# Patient Record
Sex: Female | Born: 1985 | Race: Black or African American | Hispanic: No | Marital: Single | Smoking: Current every day smoker
Health system: Southern US, Community
[De-identification: ages and names within clinical notes are randomized; demographics above are authoritative.]

## PROBLEM LIST (undated history)

## (undated) DIAGNOSIS — O009 Unspecified ectopic pregnancy without intrauterine pregnancy: Secondary | ICD-10-CM

---

## 2004-07-15 ENCOUNTER — Emergency Department (HOSPITAL_COMMUNITY): Admission: EM | Admit: 2004-07-15 | Discharge: 2004-07-15 | Payer: Self-pay | Admitting: Emergency Medicine

## 2011-09-24 ENCOUNTER — Emergency Department (HOSPITAL_BASED_OUTPATIENT_CLINIC_OR_DEPARTMENT_OTHER)
Admission: EM | Admit: 2011-09-24 | Discharge: 2011-09-24 | Disposition: A | Discharge status: Other Acute Inpatient Hospital | Payer: Medicaid Other | Source: Home / Self Care | Attending: Emergency Medicine | Admitting: Emergency Medicine

## 2011-09-24 ENCOUNTER — Encounter: Payer: Self-pay | Admitting: *Deleted

## 2011-09-24 ENCOUNTER — Inpatient Hospital Stay (HOSPITAL_COMMUNITY)
Admission: AD | Admit: 2011-09-24 | Discharge: 2011-09-24 | Discharge status: Against Medical Advice | Payer: Medicaid Other | Source: Ambulatory Visit | Attending: Obstetrics and Gynecology | Admitting: Obstetrics and Gynecology

## 2011-09-24 ENCOUNTER — Encounter (HOSPITAL_COMMUNITY): Payer: Self-pay | Admitting: *Deleted

## 2011-09-24 DIAGNOSIS — O479 False labor, unspecified: Secondary | ICD-10-CM | POA: Insufficient documentation

## 2011-09-24 DIAGNOSIS — O0930 Supervision of pregnancy with insufficient antenatal care, unspecified trimester: Secondary | ICD-10-CM | POA: Insufficient documentation

## 2011-09-24 DIAGNOSIS — O99891 Other specified diseases and conditions complicating pregnancy: Secondary | ICD-10-CM | POA: Insufficient documentation

## 2011-09-24 DIAGNOSIS — IMO0001 Reserved for inherently not codable concepts without codable children: Secondary | ICD-10-CM

## 2011-09-24 LAB — DIFFERENTIAL
Basophils Absolute: 0 10*3/uL (ref 0.0–0.1)
Basophils Relative: 0 % (ref 0–1)
Eosinophils Relative: 1 % (ref 0–5)
Monocytes Absolute: 0.7 10*3/uL (ref 0.1–1.0)
Neutro Abs: 6.2 10*3/uL (ref 1.7–7.7)

## 2011-09-24 LAB — CBC
HCT: 28.8 % — ABNORMAL LOW (ref 36.0–46.0)
MCHC: 33 g/dL (ref 30.0–36.0)
MCV: 73.9 fL — ABNORMAL LOW (ref 78.0–100.0)
Platelets: 325 10*3/uL (ref 150–400)
RDW: 15.3 % (ref 11.5–15.5)
RDW: 15.8 % — ABNORMAL HIGH (ref 11.5–15.5)
WBC: 12 10*3/uL — ABNORMAL HIGH (ref 4.0–10.5)

## 2011-09-24 MED ORDER — SODIUM CHLORIDE 0.9 % IV BOLUS (SEPSIS): 1000.0000 mL | Freq: Once | INTRAVENOUS | Status: DC

## 2011-09-24 MED ORDER — SODIUM CHLORIDE 0.9 % IV BOLUS (SEPSIS)
1000.0000 mL | Freq: Once | INTRAVENOUS | Status: AC
  Administered 2011-09-24: 1000 mL via INTRAVENOUS

## 2011-09-24 NOTE — ED Notes (Signed)
[facility] en[facility] to transport to [facility]

## 2011-09-24 NOTE — ED Notes (Signed)
Called Dr Ralene Muskrat with [facility] is paging Dr. Richardson Dopp- is on call.

## 2011-09-24 NOTE — Progress Notes (Signed)
Pt brought in by EMS c/o rectal pain and pressure; ? Labor; requesting to have a repeat c-section

## 2011-09-24 NOTE — ED Notes (Signed)
Called [facility] to transfer to the [facility]

## 2011-09-24 NOTE — ED Notes (Signed)
JUst released from prison yesterday. Last OBGYN  Visit june

## 2011-09-24 NOTE — ED Notes (Signed)
Pt states her "butt is hurting like unbearable pressure" PA Langston Masker at the bedside to evaluate pt. Fetal heart rate 137. Cervical check performed by PA Sofia states no change in dilation but babys head is very low. Pt on left side states pressure in rectal area is constant no fluid escape from vagina remains on monitor

## 2011-09-24 NOTE — ED Notes (Signed)
Pt c/o labor pain x 45 min-denies any fluid leakage-G5 P4-first delivery vaginal-other 4 csection-pt placed on monitor upon arrival-[facility]-EDP [place] at [facility] upon arrival-bimanual reports 3cm-pt currently on left side-O2 2 LNC started-monitor reads [id number]

## 2011-09-24 NOTE — ED Notes (Signed)
Pt states labor, sent here by  OBGYN  For eval.  Cramping .  Pt states 40 weeks

## 2011-09-24 NOTE — ED Notes (Signed)
Pt has very limited prenatal care; she has been incarcerated and just got out of prison yesterday; Dr Chilton Si has agreed to be her provider for emergency care up until 10/06/11

## 2011-09-24 NOTE — Progress Notes (Signed)
Clydie Braun RN from [facility] called for a pt with c/o Labor. EDC 09/21/2011. Pt states she is seen by Dr. Neva Seat and is a G5P4. EFM being applied by [facility] ED staff.

## 2011-09-24 NOTE — ED Provider Notes (Signed)
History      CSN: [id number] Arrival date & time: 09/24/2011  5:35 PM  Chief Complaint  Patient presents with  . Labor Eval     HPI Donna Zamora is a 25 y.o. female who presents to the Emergency Department complaining of labor pains that come and go. Patient last felt the baby move this AM.  Patient was due 09/21/11.  No loss of fluid.  Denies nausea and vomiting.  No vaginal bleeding. Reports her symptoms feel like contractions. First delivery was vagina, c sections since then.  OB GYN:  [facility].     PAST MEDICAL HISTORY:  History reviewed. No pertinent past medical history.  PAST SURGICAL HISTORY:  Past Surgical History  Procedure Date  . Cesarean section     FAMILY HISTORY:  History reviewed. No pertinent family history.   SOCIAL HISTORY: History   Social History  . Marital Status: Single    Spouse Name: N/A    Number of Children: N/A  . Years of Education: N/A   Social History Main Topics  . Smoking status: Current Everyday Smoker -- 0.5 packs/day  . Smokeless tobacco: None  . Alcohol Use: No  . Drug Use:   . Sexually Active:    Other Topics Concern  . None   Social History Narrative  . None     OB History    Grav Para Term Preterm Abortions TAB SAB Ect Mult Living   5 4              Review of Systems  Allergies  Review of patient's allergies indicates no known allergies.  Home Medications   Current Outpatient Rx  Name Route Sig Dispense Refill  . PRENATAL 27-0.8 MG PO TABS Oral Take 1 tablet by mouth daily.        BP 110/70  Pulse 74  Temp(Src) 98.9 F (37.2 C) (Oral)  Resp 16  SpO2 100%  LMP 12/25/2010  Physical Exam  Constitutional: She is oriented to person, place, and time. She appears well-developed and well-nourished.  HENT:  Head: Normocephalic.  Eyes: EOM are normal.  Neck: Normal range of motion.  Pulmonary/Chest: Effort normal.  Genitourinary:       Fast ultrasound: Fetal heart tones 135, no  bloody show, cervix 3 cm, 50% effaced   Musculoskeletal: Normal range of motion.  Neurological: She is alert and oriented to person, place, and time.  Psychiatric: She has a normal mood and affect.    ED Course  CRITICAL CARE Performed by: Lyanne Co Authorized by: Lyanne Co Total critical care time: 35 minutes Critical care time was exclusive of separately billable procedures and treating other patients. Critical care was necessary to treat or prevent imminent or life-threatening deterioration of the following conditions: labor and emminent delivery. Critical care was time spent personally by me on the following activities: discussions with consultants, development of treatment plan with patient or surrogate, evaluation of patient's response to treatment, examination of patient, obtaining history from patient or surrogate, ordering and review of radiographic studies and re-evaluation of patient's condition.   (including critical care time)  Labs Reviewed - No data to display No results found.   1. Active labor       MDM  Patient presenting past 2 days with what appears to be labor without loss of fluid.  I spoke with Dr. Debroah Loop who is on call for unassigned teaching service resuscitation to the [facility].  Patient is awaiting transfer  by ambulance this time.  Fetal heart rate appears to be around 130s the patient does appear to have some intermittent not necessarily foreign contractions on tocometry.  quick bedside ultrasound does is demonstrate baby in the head down position.  The patient will be n.p.o. at this time for suspected C-section later this evening.  Initial initial conversation was with Dr. Richardson Dopp who is covering for [facility] however it appears the patient has been discharged from [facility] Haskell service and therefore she will be cared for by the [facility] team.     I personally performed the services described in this documentation, which was scribed in my  presence. The recorded information has been reviewed and considered.    Lyanne Co, MD 09/24/11 [phone]

## 2011-09-25 LAB — RPR: RPR Ser Ql: NONREACTIVE

## 2011-09-25 LAB — HEPATITIS B SURFACE ANTIGEN: Hepatitis B Surface Ag: NEGATIVE

## 2011-10-29 ENCOUNTER — Encounter (HOSPITAL_COMMUNITY): Payer: Self-pay | Admitting: *Deleted

## 2012-05-25 ENCOUNTER — Encounter (HOSPITAL_BASED_OUTPATIENT_CLINIC_OR_DEPARTMENT_OTHER): Payer: Self-pay | Admitting: *Deleted

## 2012-05-25 ENCOUNTER — Emergency Department (HOSPITAL_BASED_OUTPATIENT_CLINIC_OR_DEPARTMENT_OTHER)
Admission: EM | Admit: 2012-05-25 | Discharge: 2012-05-25 | Disposition: A | Discharge status: Home / Self Care | Payer: Self-pay | Attending: Emergency Medicine | Admitting: Emergency Medicine

## 2012-05-25 DIAGNOSIS — R109 Unspecified abdominal pain: Secondary | ICD-10-CM | POA: Insufficient documentation

## 2012-05-25 DIAGNOSIS — N760 Acute vaginitis: Secondary | ICD-10-CM

## 2012-05-25 DIAGNOSIS — F17200 Nicotine dependence, unspecified, uncomplicated: Secondary | ICD-10-CM | POA: Insufficient documentation

## 2012-05-25 DIAGNOSIS — N898 Other specified noninflammatory disorders of vagina: Secondary | ICD-10-CM | POA: Insufficient documentation

## 2012-05-25 HISTORY — DX: Unspecified ectopic pregnancy without intrauterine pregnancy: O00.90

## 2012-05-25 LAB — URINALYSIS, ROUTINE W REFLEX MICROSCOPIC
Glucose, UA: NEGATIVE mg/dL
Hgb urine dipstick: NEGATIVE
Ketones, ur: NEGATIVE mg/dL
Protein, ur: NEGATIVE mg/dL

## 2012-05-25 LAB — WET PREP, GENITAL

## 2012-05-25 MED ORDER — METRONIDAZOLE 500 MG PO TABS: 500.0000 mg | ORAL_TABLET | Freq: Two times a day (BID) | ORAL | Status: AC

## 2012-05-25 NOTE — ED Notes (Signed)
Pelvic pain x 1 day- vaginal d/c x 3 days

## 2012-05-25 NOTE — ED Provider Notes (Signed)
History   This chart was scribed for Geoffery Lyons, MD by Melba Coon. The patient was seen in room MH04/MH04 and the patient's care was started at 6:09PM.    CSN: [id number]  Arrival date & time 05/25/12  1716   First MD Initiated Contact with Patient 05/25/12 1810      Chief Complaint  Patient presents with  . Abdominal Pain  . Vaginal Discharge    (Consider location/radiation/quality/duration/timing/severity/associated sxs/prior treatment) HPI Donna Zamora is a 26 y.o. female who presents to the Emergency Department complaining of constant, moderate to severe central abdominal pain with an onset yesterday; pt also c/o vaginal d/c for 3 days. Pt woke up this morning with worsening pain. Pt does not think she is pregnant. Pt has had a tubal pregnancy and has had 4 C-sections. LNMP: last week. Nausea present. No HA, fever, neck pain, sore throat, rash, back pain, CP, SOB, vomit, diarrhea, dysuria, or extremity pain, edema, weakness, numbness, or tingling. No known allergies. No other pertinent medical symptoms.   Past Medical History  Diagnosis Date  . No pertinent past medical history   . Ectopic pregnancy     Past Surgical History  Procedure Date  . Cesarean section     No family history on file.  History  Substance Use Topics  . Smoking status: Current Everyday Smoker -- 0.5 packs/day  . Smokeless tobacco: Never Used  . Alcohol Use: No    OB History    Grav Para Term Preterm Abortions TAB SAB Ect Mult Living   5 4 4       4       Review of Systems 10 Systems reviewed and all are negative for acute change except as noted in the HPI.   Allergies  Review of patient's allergies indicates no known allergies.  Home Medications   Current Outpatient Rx  Name Route Sig Dispense Refill  . PRENATAL 27-0.8 MG PO TABS Oral Take 1 tablet by mouth daily.        BP 105/70  Pulse 93  Temp(Src) 98.2 F (36.8 C) (Oral)  Resp 18  Ht 5\' 6"  (1.676 m)  Wt 153 lb (69.4  kg)  BMI 24.69 kg/m2  SpO2 100%  LMP 05/11/2012  Breastfeeding? No  Physical Exam  Nursing note and vitals reviewed. Constitutional: She appears well-developed and well-nourished.       Awake, alert, nontoxic appearance.  HENT:  Head: Normocephalic and atraumatic.  Eyes: Right eye exhibits no discharge. Left eye exhibits no discharge.  Neck: Normal range of motion. Neck supple.  Cardiovascular: Normal rate, regular rhythm and normal heart sounds.   No murmur heard. Pulmonary/Chest: Effort normal. She exhibits no tenderness.  Abdominal: Soft. There is tenderness (suprapubic). There is no rebound.  Genitourinary: Vaginal discharge (scant) found.       No CMT or adnexal masses  Musculoskeletal: Normal range of motion. She exhibits no tenderness (No cva tenderness).       Baseline ROM, no obvious new focal weakness.  Neurological: She is alert.       Mental status and motor strength appears baseline for patient and situation.  Skin: Skin is warm. No rash noted.  Psychiatric: She has a normal mood and affect. Her behavior is normal.    ED Course  Procedures (including critical care time)  DIAGNOSTIC STUDIES: Oxygen Saturation is 100% on room air, normal by my interpretation.    COORDINATION OF CARE:   Labs Reviewed - No data to display No results  found.   No diagnosis found.    MDM  The wet prep shows many clue cells, this appears to be bv.  Will treat with flagyl, follow up prn if worsens.  GC/Chlamydia pending.  I personally performed the services described in this documentation, which was scribed in my presence. The recorded information has been reviewed and considered.        Geoffery Lyons, MD 05/25/12 [phone]

## 2012-05-25 NOTE — Discharge Instructions (Signed)
Bacterial Vaginosis Bacterial vaginosis (BV) is a vaginal infection where the normal balance of bacteria in the vagina is disrupted. The normal balance is then replaced by an overgrowth of certain bacteria. There are several different kinds of bacteria that can cause BV. BV is the most common vaginal infection in women of childbearing age. CAUSES   The cause of BV is not fully understood. BV develops when there is an increase or imbalance of harmful bacteria.   Some activities or behaviors can upset the normal balance of bacteria in the vagina and put women at increased risk including:   Having a new sex partner or multiple sex partners.   Douching.   Using an intrauterine device (IUD) for contraception.   It is not clear what role sexual activity plays in the development of BV. However, women that have never had sexual intercourse are rarely infected with BV.  Women do not get BV from toilet seats, bedding, swimming pools or from touching objects around them.  SYMPTOMS   Grey vaginal discharge.   A fish-like odor with discharge, especially after sexual intercourse.   Itching or burning of the vagina and vulva.   Burning or pain with urination.   Some women have no signs or symptoms at all.  DIAGNOSIS  Your caregiver must examine the vagina for signs of BV. Your caregiver will perform lab tests and look at the sample of vaginal fluid through a microscope. They will look for bacteria and abnormal cells (clue cells), a pH test higher than 4.5, and a positive amine test all associated with BV.  RISKS AND COMPLICATIONS   Pelvic inflammatory disease (PID).   Infections following gynecology surgery.   Developing HIV.   Developing herpes virus.  TREATMENT  Sometimes BV will clear up without treatment. However, all women with symptoms of BV should be treated to avoid complications, especially if gynecology surgery is planned. Female partners generally do not need to be treated. However,  BV may spread between female sex partners so treatment is helpful in preventing a recurrence of BV.   BV may be treated with antibiotics. The antibiotics come in either pill or vaginal cream forms. Either can be used with nonpregnant or pregnant women, but the recommended dosages differ. These antibiotics are not harmful to the baby.   BV can recur after treatment. If this happens, a second round of antibiotics will often be prescribed.   Treatment is important for pregnant women. If not treated, BV can cause a premature delivery, especially for a pregnant woman who had a premature birth in the past. All pregnant women who have symptoms of BV should be checked and treated.   For chronic reoccurrence of BV, treatment with a type of prescribed gel vaginally twice a week is helpful.  HOME CARE INSTRUCTIONS   Finish all medication as directed by your caregiver.   Do not have sex until treatment is completed.   Tell your sexual partner that you have a vaginal infection. They should see their caregiver and be treated if they have problems, such as a mild rash or itching.   Practice safe sex. Use condoms. Only have 1 sex partner.  PREVENTION  Basic prevention steps can help reduce the risk of upsetting the natural balance of bacteria in the vagina and developing BV:  Do not have sexual intercourse (be abstinent).   Do not douche.   Use all of the medicine prescribed for treatment of BV, even if the signs and symptoms go away.     Tell your sex partner if you have BV. That way, they can be treated, if needed, to prevent reoccurrence.  SEEK MEDICAL CARE IF:   Your symptoms are not improving after 3 days of treatment.   You have increased discharge, pain, or fever.  MAKE SURE YOU:   Understand these instructions.   Will watch your condition.   Will get help right away if you are not doing well or get worse.  FOR MORE INFORMATION  Division of STD Prevention (DSTDP), Centers for Disease  Control and Prevention: www.cdc.gov/std American Social Health Association (ASHA): www.ashastd.org  Document Released: 12/03/2005 Document Revised: 11/22/2011 Document Reviewed: 05/26/2009 ExitCare Patient Information 2012 [employer]. 

## 2012-05-26 LAB — GC/CHLAMYDIA PROBE AMP, GENITAL: Chlamydia, DNA Probe: NEGATIVE

## 2012-06-12 ENCOUNTER — Emergency Department (HOSPITAL_BASED_OUTPATIENT_CLINIC_OR_DEPARTMENT_OTHER)
Admission: EM | Admit: 2012-06-12 | Discharge: 2012-06-13 | Disposition: A | Discharge status: Home / Self Care | Payer: Self-pay | Attending: Emergency Medicine | Admitting: Emergency Medicine

## 2012-06-12 ENCOUNTER — Encounter (HOSPITAL_BASED_OUTPATIENT_CLINIC_OR_DEPARTMENT_OTHER): Payer: Self-pay

## 2012-06-12 DIAGNOSIS — Z880 Allergy status to penicillin: Secondary | ICD-10-CM | POA: Insufficient documentation

## 2012-06-12 DIAGNOSIS — O4690 Antepartum hemorrhage, unspecified, unspecified trimester: Secondary | ICD-10-CM

## 2012-06-12 DIAGNOSIS — F17200 Nicotine dependence, unspecified, uncomplicated: Secondary | ICD-10-CM | POA: Insufficient documentation

## 2012-06-12 DIAGNOSIS — N898 Other specified noninflammatory disorders of vagina: Secondary | ICD-10-CM | POA: Insufficient documentation

## 2012-06-12 DIAGNOSIS — Z888 Allergy status to other drugs, medicaments and biological substances status: Secondary | ICD-10-CM | POA: Insufficient documentation

## 2012-06-12 DIAGNOSIS — F1910 Other psychoactive substance abuse, uncomplicated: Secondary | ICD-10-CM

## 2012-06-12 LAB — URINALYSIS, ROUTINE W REFLEX MICROSCOPIC
Ketones, ur: 15 mg/dL — AB
Leukocytes, UA: NEGATIVE
Nitrite: NEGATIVE
Protein, ur: 100 mg/dL — AB

## 2012-06-12 LAB — URINE MICROSCOPIC-ADD ON

## 2012-06-12 NOTE — ED Notes (Signed)
C/o vaginal bleeding/spotting/cramping started today-Positive home pregnancy test June 10-reports pd x 2 in May-G7 P6

## 2012-06-13 ENCOUNTER — Other Ambulatory Visit (HOSPITAL_BASED_OUTPATIENT_CLINIC_OR_DEPARTMENT_OTHER): Payer: Self-pay | Admitting: Emergency Medicine

## 2012-06-13 ENCOUNTER — Encounter (HOSPITAL_BASED_OUTPATIENT_CLINIC_OR_DEPARTMENT_OTHER): Payer: Self-pay | Admitting: Emergency Medicine

## 2012-06-13 ENCOUNTER — Encounter (HOSPITAL_BASED_OUTPATIENT_CLINIC_OR_DEPARTMENT_OTHER): Payer: Self-pay

## 2012-06-13 ENCOUNTER — Ambulatory Visit (HOSPITAL_BASED_OUTPATIENT_CLINIC_OR_DEPARTMENT_OTHER)
Admit: 2012-06-13 | Discharge: 2012-06-13 | Disposition: A | Discharge status: Home / Self Care | Payer: Medicaid Other | Attending: Emergency Medicine | Admitting: Emergency Medicine

## 2012-06-13 DIAGNOSIS — R58 Hemorrhage, not elsewhere classified: Secondary | ICD-10-CM

## 2012-06-13 DIAGNOSIS — O209 Hemorrhage in early pregnancy, unspecified: Secondary | ICD-10-CM | POA: Insufficient documentation

## 2012-06-13 LAB — WET PREP, GENITAL: Yeast Wet Prep HPF POC: NONE SEEN

## 2012-06-13 LAB — GC/CHLAMYDIA PROBE AMP, GENITAL
Chlamydia, DNA Probe: NEGATIVE
GC Probe Amp, Genital: NEGATIVE

## 2012-06-13 LAB — RAPID URINE DRUG SCREEN, HOSP PERFORMED
Benzodiazepines: NOT DETECTED
Cocaine: POSITIVE — AB
Tetrahydrocannabinol: POSITIVE — AB

## 2012-06-13 NOTE — ED Provider Notes (Signed)
History     CSN: [id number]  Arrival date & time 06/12/12  2232   First MD Initiated Contact with Patient 06/13/12 0002      Chief Complaint  Patient presents with  . Vaginal Bleeding    (Consider location/radiation/quality/duration/timing/severity/associated sxs/prior treatment) HPI This is a 26 year old black female who was seen here on June 9 and diagnosed with bacterial vaginosis. Her pregnancy test was negative at that time. She has subsequently had a positive home pregnancy test. She is here because of vaginal spotting that began today. The bleeding has not been severe. She's also had some moderate pelvic cramping associated with the spotting. She denies abdominal tenderness. She is gravida 7 para 6. Her blood type is O+ per old records.  Past Medical History  Diagnosis Date  . Ectopic pregnancy     Past Surgical History  Procedure Date  . Cesarean section     No family history on file.  History  Substance Use Topics  . Smoking status: Current Everyday Smoker -- 0.5 packs/day  . Smokeless tobacco: Never Used  . Alcohol Use: No    OB History    Grav Para Term Preterm Abortions TAB SAB Ect Mult Living   5 4 4       4       Review of Systems  All other systems reviewed and are negative.    Allergies  Penicillins and Risperidone and related  Home Medications   Current Outpatient Rx  Name Route Sig Dispense Refill  . IBUPROFEN 800 MG PO TABS Oral Take 800 mg by mouth every 8 (eight) hours as needed. Patient used this medication for her abdominal pain.      BP 130/88  Pulse 103  Temp 98.7 F (37.1 C) (Oral)  Resp 16  Ht 5\' 6"  (1.676 m)  Wt 153 lb (69.4 kg)  BMI 24.69 kg/m2  SpO2 100%  LMP 05/11/2012  Physical Exam General: Well-developed, well-nourished female in no acute distress; appearance consistent with age of record HENT: normocephalic, atraumatic Eyes: pupils equal round and reactive to light; extraocular muscles intact Neck:  supple Heart: regular rate and rhythm Lungs: clear to auscultation bilaterally Abdomen: soft; nondistended; nontender; bowel sounds present GU: Normal external genitalia; vaginal bleeding; no vaginal discharge; abnormal vaginal odor; cervical motion tenderness; no adnexal tenderness Extremities: No deformity; full range of motion Neurologic: Awake, alert and oriented; motor function intact in all extremities and symmetric; no facial droop Skin: Warm and dry Psychiatric: Tearful     ED Course  Procedures (including critical care time)     MDM   Nursing notes and vitals signs, including pulse oximetry, reviewed.  Summary of this visit's results, reviewed by myself:  Labs:  Results for orders placed during the hospital encounter of 06/12/12  URINALYSIS, ROUTINE W REFLEX MICROSCOPIC      Component Value Range   Color, Urine AMBER (*) YELLOW   APPearance CLOUDY (*) CLEAR   Specific Gravity, Urine 1.039 (*) 1.005 - 1.030   pH 6.0  5.0 - 8.0   Glucose, UA NEGATIVE  NEGATIVE mg/dL   Hgb urine dipstick NEGATIVE  NEGATIVE   Bilirubin Urine SMALL (*) NEGATIVE   Ketones, ur 15 (*) NEGATIVE mg/dL   Protein, ur 578 (*) NEGATIVE mg/dL   Urobilinogen, UA 1.0  0.0 - 1.0 mg/dL   Nitrite NEGATIVE  NEGATIVE   Leukocytes, UA NEGATIVE  NEGATIVE  PREGNANCY, URINE      Component Value Range   Preg Test, Ur POSITIVE (*)  NEGATIVE  URINE MICROSCOPIC-ADD ON      Component Value Range   Squamous Epithelial / LPF MANY (*) RARE   WBC, UA 0-2  <3 WBC/hpf   RBC / HPF 0-2  <3 RBC/hpf   Bacteria, UA MANY (*) RARE  URINE RAPID DRUG SCREEN (HOSP PERFORMED)      Component Value Range   Opiates NONE DETECTED  NONE DETECTED   Cocaine POSITIVE (*) NONE DETECTED   Benzodiazepines NONE DETECTED  NONE DETECTED   Amphetamines NONE DETECTED  NONE DETECTED   Tetrahydrocannabinol POSITIVE (*) NONE DETECTED   Barbiturates NONE DETECTED  NONE DETECTED  WET PREP, GENITAL      Component Value Range   Yeast  Wet Prep HPF POC NONE SEEN  NONE SEEN   Trich, Wet Prep NONE SEEN  NONE SEEN   Clue Cells Wet Prep HPF POC FEW (*) NONE SEEN   WBC, Wet Prep HPF POC FEW (*) NONE SEEN  HCG, QUANTITATIVE, PREGNANCY      Component Value Range   hCG, Beta Chain, Quant, S 2884 (*) <5 mIU/mL            Hanley Seamen, MD 06/13/12 0124

## 2012-06-13 NOTE — ED Provider Notes (Signed)
PT was seen last night by Dr. Read Drivers, came back this am for u/s which shows empty uterus, no evidence of ectopic.  Pt with minimal pain now.  Quant 2884.  Contacted the ob on call for her office at [facility] who is Dr Ferdinand Cava who will see pt on Monday for f/u.  I went back to the lobby to let the pt know about this and she has left, even though I had asked her to wait while I arrange follow up.  I did advise her of my concern regarding ectopic.  I attempted to call pt, by the phone number given is invalid.  Rolan Bucco, MD 06/13/12 1043

## 2013-05-30 IMAGING — US US OB TRANSVAGINAL
1 series · 13 of 28 positions shown · non-contrast
Comparison: None.

CLINICAL DATA: Pregnancy with vaginal bleeding

OBSTETRIC <14 WK US AND TRANSVAGINAL OB US
TECHNIQUE: Both transabdominal and transvaginal ultrasound
examinations were performed for complete evaluation of the
gestation as well as the maternal uterus, adnexal regions, and
pelvic cul-de-sac.  Transvaginal technique was performed to assess
early pregnancy.

[Series 1: us ob transvaginal · 0.24mm/px · 13 of 50 slices shown]
[im 2/50]
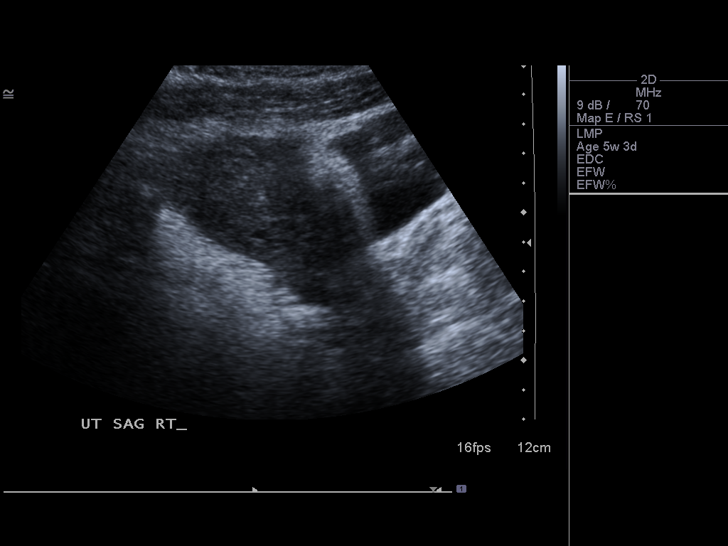
[im 6/50]
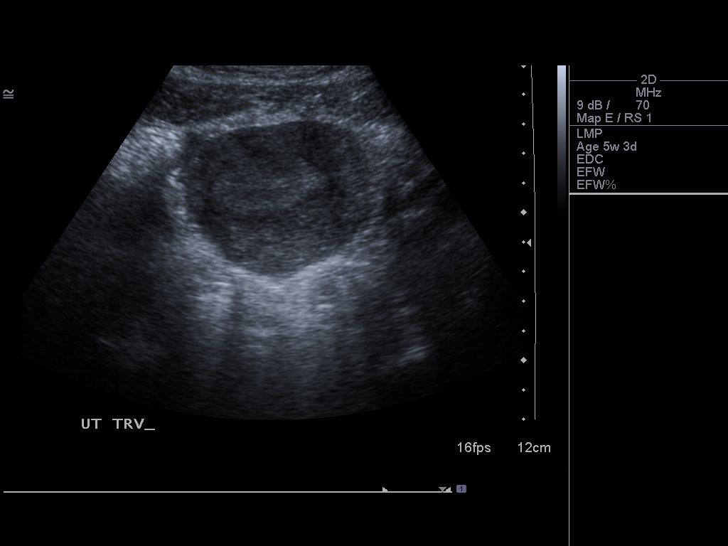
[im 10/50]
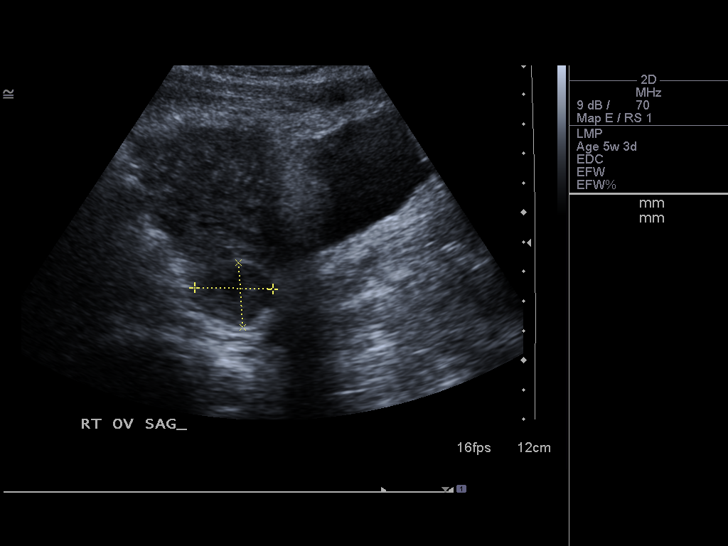
[im 13/50]
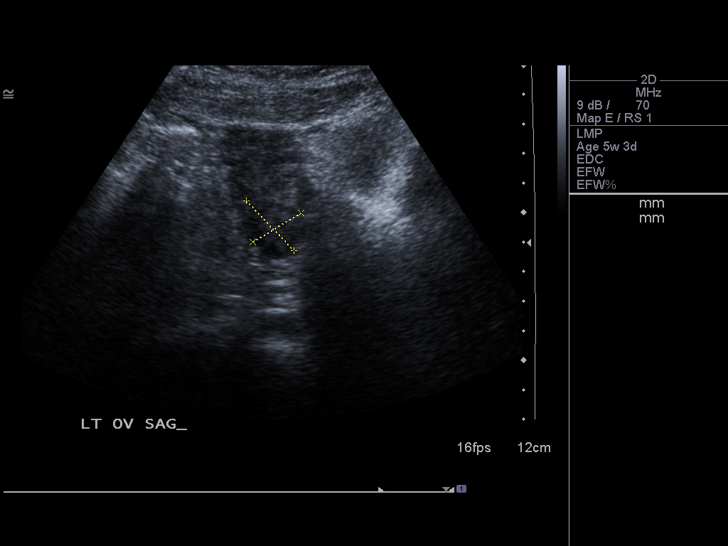
[im 17/50]
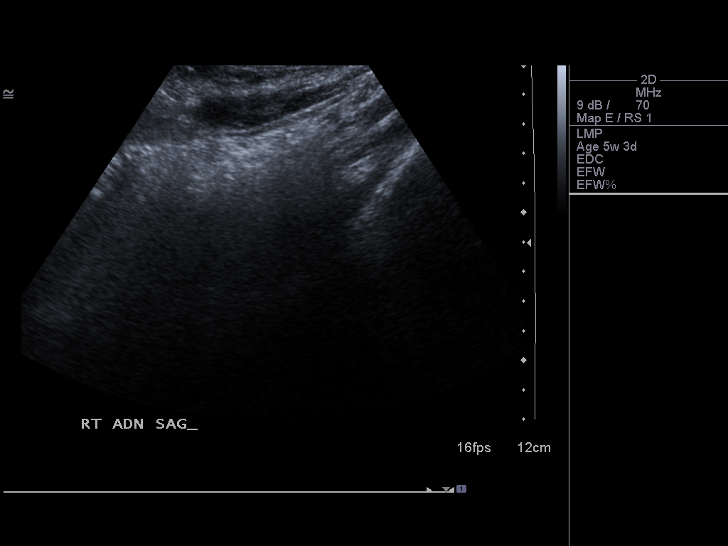
[im 20/50]
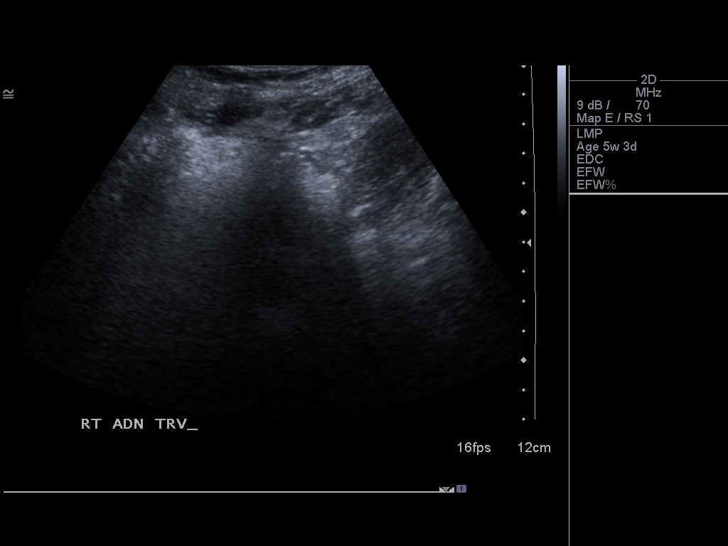
[im 26/50]
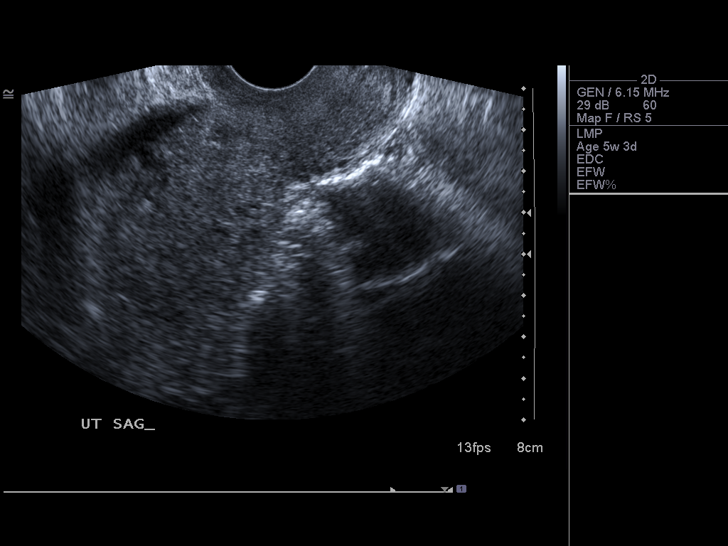
[im 30/50]
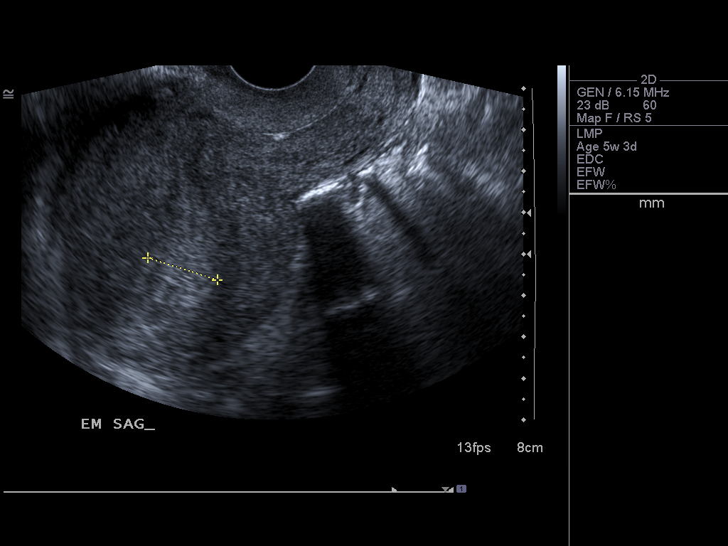
[im 33/50]
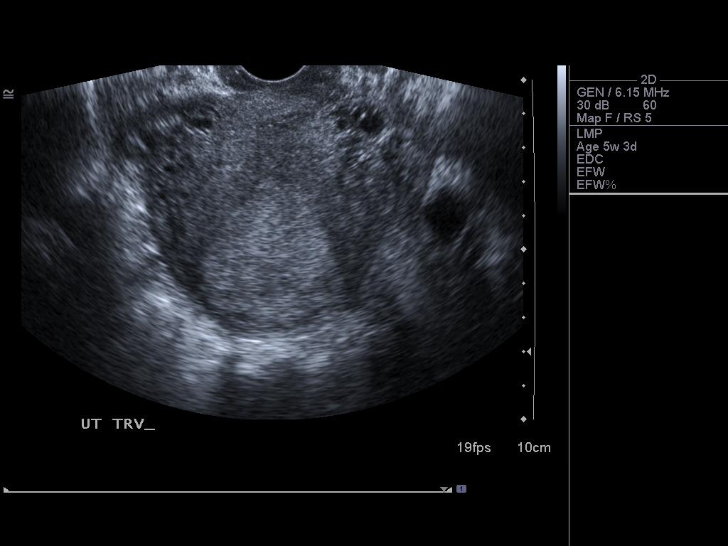
[im 37/50]
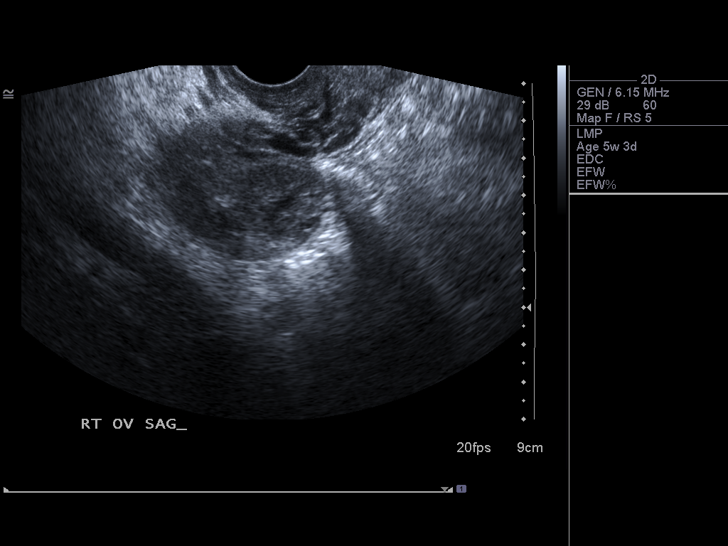
[im 40/50]
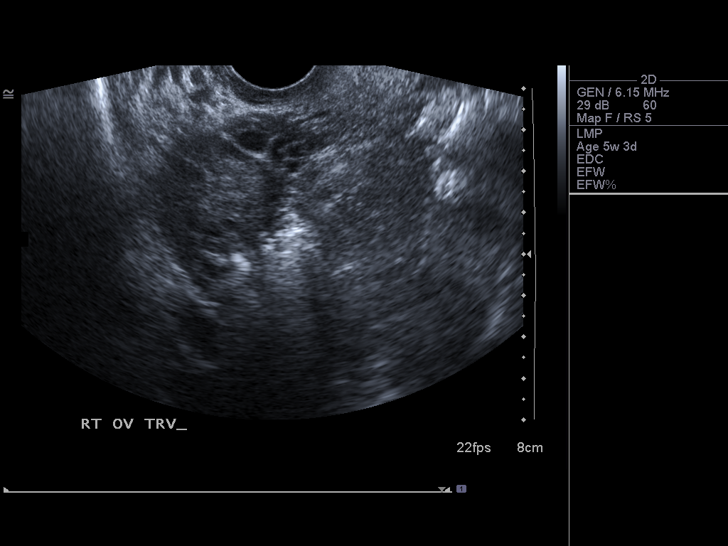
[im 44/50]
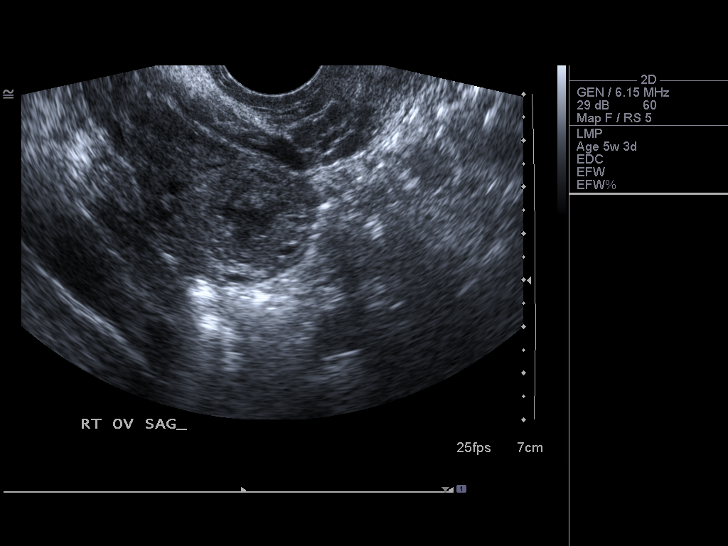
[im 48/50]
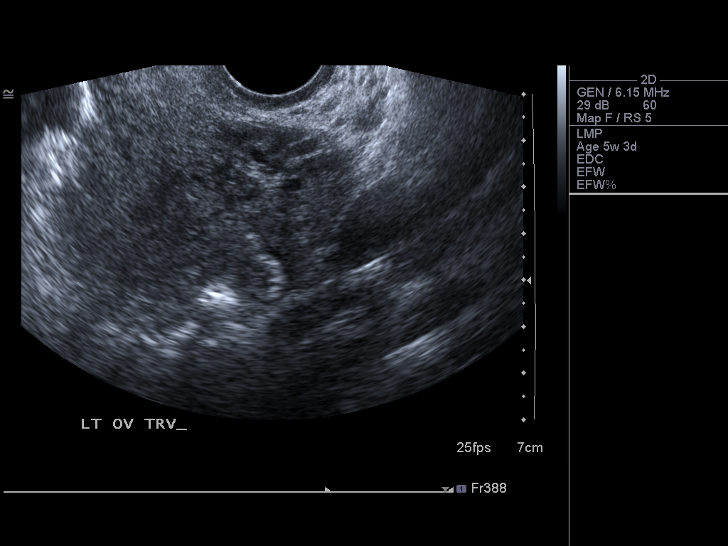

[13 of 28 positions shown; findings below may reference images not displayed]

Intrauterine gestational sac:  Not seen
Yolk sac: Not seen
Embryo: Not seen
Cardiac Activity: Not applicable
Heart Rate:  bpm

MSD:   mm      w     d
CRL:    mm     w     d        US EDC:

Maternal uterus/adnexae:
The uterus is anteverted and retroflexed and demonstrates a
homogeneous myometrium.  The endometrial lining is mildly thickened
with a width of 17.6 mm.

The right ovary measures 4.3 x 3.1 x 2.3 cm and contains a corpus
luteum.  The left ovary measures 2.8 x 1.9 x 1.7 cm and has a
normal appearance.  No separate adnexal masses are seen.  A trace
amount of simple free fluid is noted in the cul-de-sac.  No complex
fluid is identified.
IMPRESSION: Thickened endometrial lining with no evidence for intrauterine
gestational sac identified.

Normal ovaries with right corpus luteum, no visualized separate
adnexal masses or complex fluid.

Today's findings could be been due to an intrauterine pregnancy too
early to be visualized sonographically, sonographically occult
ectopic gestation or abnormal nonprogressing gestation. Correlation
with serial beta HCG is recommended to help distinguish between
these possibilities.  We would typically expect to see evidence for
an intrauterine gestational HCG greater than 5877.

## 2014-10-18 ENCOUNTER — Encounter (HOSPITAL_BASED_OUTPATIENT_CLINIC_OR_DEPARTMENT_OTHER): Payer: Self-pay

## 2020-11-14 ENCOUNTER — Other Ambulatory Visit: Payer: Self-pay

## 2020-11-14 ENCOUNTER — Encounter (HOSPITAL_BASED_OUTPATIENT_CLINIC_OR_DEPARTMENT_OTHER): Payer: Self-pay | Admitting: Emergency Medicine

## 2020-11-14 ENCOUNTER — Emergency Department (HOSPITAL_BASED_OUTPATIENT_CLINIC_OR_DEPARTMENT_OTHER)
Admission: EM | Admit: 2020-11-14 | Discharge: 2020-11-14 | Disposition: A | Discharge status: Home / Self Care | Payer: Self-pay | Attending: Emergency Medicine | Admitting: Emergency Medicine

## 2020-11-14 DIAGNOSIS — N764 Abscess of vulva: Secondary | ICD-10-CM | POA: Insufficient documentation

## 2020-11-14 DIAGNOSIS — F17200 Nicotine dependence, unspecified, uncomplicated: Secondary | ICD-10-CM | POA: Insufficient documentation

## 2020-11-14 DIAGNOSIS — N898 Other specified noninflammatory disorders of vagina: Secondary | ICD-10-CM

## 2020-11-14 LAB — WET PREP, GENITAL
Clue Cells Wet Prep HPF POC: NONE SEEN
Sperm: NONE SEEN
Trich, Wet Prep: NONE SEEN
WBC, Wet Prep HPF POC: NONE SEEN
Yeast Wet Prep HPF POC: NONE SEEN

## 2020-11-14 LAB — URINALYSIS, ROUTINE W REFLEX MICROSCOPIC
Glucose, UA: NEGATIVE mg/dL
Hgb urine dipstick: NEGATIVE
Ketones, ur: NEGATIVE mg/dL
Nitrite: NEGATIVE
Protein, ur: NEGATIVE mg/dL
Specific Gravity, Urine: >1.03 — ABNORMAL HIGH (ref 1.005–1.030)
pH: 6.5 (ref 5.0–8.0)

## 2020-11-14 LAB — URINALYSIS, MICROSCOPIC (REFLEX)

## 2020-11-14 LAB — PREGNANCY, URINE: Preg Test, Ur: NEGATIVE

## 2020-11-14 MED ORDER — LIDOCAINE HCL (PF) 1 % IJ SOLN
INTRAMUSCULAR | Status: AC
  Filled 2020-11-14: qty 5

## 2020-11-14 MED ORDER — DOXYCYCLINE HYCLATE 100 MG PO TABS
100.0000 mg | ORAL_TABLET | Freq: Once | ORAL | Status: AC
  Administered 2020-11-14: 100 mg via ORAL
  Filled 2020-11-14: qty 1

## 2020-11-14 MED ORDER — DOXYCYCLINE HYCLATE 100 MG PO CAPS: 100.0000 mg | ORAL_CAPSULE | Freq: Two times a day (BID) | ORAL | 0 refills | Status: DC

## 2020-11-14 MED ORDER — CEFTRIAXONE SODIUM 500 MG IJ SOLR
500.0000 mg | Freq: Once | INTRAMUSCULAR | Status: AC
  Administered 2020-11-14: 500 mg via INTRAMUSCULAR
  Filled 2020-11-14: qty 500

## 2020-11-14 NOTE — ED Notes (Signed)
Pt states she needs to leave due to babysitter issues. EDP notified.

## 2020-11-14 NOTE — ED Triage Notes (Signed)
Reports noticing a vaginal odor.  Here for STD check.

## 2020-11-14 NOTE — ED Notes (Signed)
Pt walked out without formal discharge while papers were being printed. Papers handed to pt in lobby with Rx and discussed briefly with her, but no VS obtained as pt was attempting to leave.

## 2020-11-14 NOTE — Discharge Instructions (Addendum)
You were seen today with concerns for vaginal odor.  You were tested and treated for STDs.  Always use condoms.  Abstain from sexual activity for the next 10 days.  Take medications as prescribed.  Regarding your boil, use sitz baths and warm compresses.  If you note increasing pain, swelling, or redness, this may need to be drained.

## 2020-11-14 NOTE — ED Provider Notes (Signed)
[facility] EMERGENCY DEPARTMENT Provider Note   CSN: [id number] Arrival date & time: 11/14/20  0009     History Chief Complaint  Patient presents with  . vaginal odor    Donna Zamora is a 34 y.o. female.  HPI     This is a 34 year old female with no reported past medical history who presents with vaginal odor.  Patient reports that she was having unprotected sex with one of her sexual partners when she noticed an odor.  She has not noted increased discharge.  She reports that she has a total of 3 sexual partners.  However, this is her only sexual partner she does not use protection with.  She does not believe herself to be pregnant.  She denies any lower abdominal pain, cramping, other symptoms.  Partner is asymptomatic and has presented to be treated as well.  Of note, patient has also noted a spot on her right labia which is slightly swollen and painful.  Past Medical History:  Diagnosis Date  . Ectopic pregnancy     There are no problems to display for this patient.   Past Surgical History:  Procedure Laterality Date  . CESAREAN SECTION       OB History    Gravida  6   Para  4   Term  4   Preterm      AB      Living  4     SAB      TAB      Ectopic      Multiple      Live Births              No family history on file.  Social History   Tobacco Use  . Smoking status: Current Every Day Smoker    Packs/day: 0.50  . Smokeless tobacco: Never Used  Substance Use Topics  . Alcohol use: No  . Drug use: No    Home Medications Prior to Admission medications   Medication Sig Start Date End Date Taking? Authorizing Provider  doxycycline (VIBRAMYCIN) 100 MG capsule Take 1 capsule (100 mg total) by mouth 2 (two) times daily. 11/14/20   Derk Doubek, Mayer Masker, MD  ibuprofen (ADVIL,MOTRIN) 800 MG tablet Take 800 mg by mouth every 8 (eight) hours as needed. Patient used this medication for her abdominal pain.    [provider]     Allergies    Penicillins and Risperidone and related  Review of Systems   Review of Systems  Constitutional: Negative for fever.  Gastrointestinal: Negative for abdominal pain, nausea and vomiting.  Genitourinary: Positive for vaginal discharge and vaginal pain. Negative for dysuria and vaginal bleeding.  All other systems reviewed and are negative.   Physical Exam Updated Vital Signs BP (!) 155/89 (BP Location: Left Arm)   Pulse (!) 107   Temp 98.5 F (36.9 C) (Oral)   Resp 18   Ht 1.664 m (5' 5.5")   Wt 74.8 kg   SpO2 100%   BMI 27.04 kg/m   Physical Exam Vitals and nursing note reviewed.  Constitutional:      Appearance: She is well-developed. She is not ill-appearing.  HENT:     Head: Normocephalic and atraumatic.     Nose: Nose normal.     Mouth/Throat:     Mouth: Mucous membranes are moist.  Eyes:     Pupils: Pupils are equal, round, and reactive to light.  Cardiovascular:     Rate and Rhythm:  Normal rate and regular rhythm.  Pulmonary:     Effort: Pulmonary effort is normal. No respiratory distress.  Abdominal:     Palpations: Abdomen is soft.     Tenderness: There is no abdominal tenderness.  Genitourinary:    Comments: External exam with slight swelling and fluctuance of the right labia, no spontaneous drainage, no overlying skin changes, internal exam deferred Musculoskeletal:     Cervical back: Neck supple.  Skin:    General: Skin is warm and dry.  Neurological:     Mental Status: She is alert and oriented to person, place, and time.  Psychiatric:        Mood and Affect: Mood normal.     ED Results / Procedures / Treatments   Labs (all labs ordered are listed, but only abnormal results are displayed) Labs Reviewed  URINALYSIS, ROUTINE W REFLEX MICROSCOPIC - Abnormal; Notable for the following components:      Result Value   APPearance HAZY (*)    Specific Gravity, Urine >1.030 (*)    Bilirubin Urine SMALL (*)    Leukocytes,Ua TRACE (*)     All other components within normal limits  URINALYSIS, MICROSCOPIC (REFLEX) - Abnormal; Notable for the following components:   Bacteria, UA MANY (*)    All other components within normal limits  WET PREP, GENITAL  PREGNANCY, URINE  GC/CHLAMYDIA PROBE AMP ([place]) NOT AT [facility]    EKG None  Radiology No results found.  Procedures Procedures (including critical care time)  Medications Ordered in ED Medications  lidocaine (PF) (XYLOCAINE) 1 % injection (has no administration in time range)  cefTRIAXone (ROCEPHIN) injection 500 mg (500 mg Intramuscular Given 11/14/20 0125)  doxycycline (VIBRA-TABS) tablet 100 mg (100 mg Oral Given 11/14/20 0125)    ED Course  I have reviewed the triage vital signs and the nursing notes.  Pertinent labs & imaging results that were available during my care of the patient were reviewed by me and considered in my medical decision making (see chart for details).    MDM Rules/Calculators/A&P                          Patient presents with vaginal odor.  She is nontoxic-appearing and vital signs are largely reassuring.  She elected to self swab.  She has no abdominal pain or other symptoms to suggest PID, ovarian pathology, UTI.  I did do an external exam which shows likely a developing abscess on the right labia majora.  There are no overlying skin changes and no spontaneous drainage.  Area of fluctuance is very small, do not feel it warrants drainage at this time; however, recommend that patient use warm compresses and warm baths.  She was tested and treated for STDs empirically.  Patient left prior to wet prep results.  She was sent home with doxycycline.  She was counseled regarding safe sex practices and abstinence for the next 10 days.  After history, exam, and medical workup I feel the patient has been appropriately medically screened and is safe for discharge home. Pertinent diagnoses were discussed with the patient. Patient was given return  precautions.  Final Clinical Impression(s) / ED Diagnoses Final diagnoses:  Vaginal odor  Labial abscess    Rx / DC Orders ED Discharge Orders         Ordered    doxycycline (VIBRAMYCIN) 100 MG capsule  2 times daily        11/14/20 0131  Shon Baton, MD 11/14/20 [phone]

## 2020-11-15 LAB — GC/CHLAMYDIA PROBE AMP (~~LOC~~) NOT AT ARMC
Chlamydia: NEGATIVE
Comment: NEGATIVE
Comment: NORMAL
Neisseria Gonorrhea: POSITIVE — AB

## 2022-10-04 ENCOUNTER — Other Ambulatory Visit: Payer: Self-pay

## 2022-10-04 ENCOUNTER — Emergency Department (HOSPITAL_COMMUNITY)
Admission: EM | Admit: 2022-10-04 | Discharge: 2022-10-05 | Disposition: A | Discharge status: Other Acute Inpatient Hospital | Payer: Self-pay | Attending: Emergency Medicine | Admitting: Emergency Medicine

## 2022-10-04 ENCOUNTER — Encounter (HOSPITAL_COMMUNITY): Payer: Self-pay | Admitting: Emergency Medicine

## 2022-10-04 ENCOUNTER — Emergency Department (HOSPITAL_COMMUNITY)
Admission: EM | Admit: 2022-10-04 | Discharge: 2022-10-04 | Discharge status: Against Medical Advice | Payer: Self-pay | Attending: Emergency Medicine | Admitting: Emergency Medicine

## 2022-10-04 DIAGNOSIS — F1994 Other psychoactive substance use, unspecified with psychoactive substance-induced mood disorder: Secondary | ICD-10-CM

## 2022-10-04 DIAGNOSIS — R45851 Suicidal ideations: Secondary | ICD-10-CM | POA: Insufficient documentation

## 2022-10-04 DIAGNOSIS — R440 Auditory hallucinations: Secondary | ICD-10-CM | POA: Insufficient documentation

## 2022-10-04 DIAGNOSIS — R441 Visual hallucinations: Secondary | ICD-10-CM | POA: Insufficient documentation

## 2022-10-04 DIAGNOSIS — Z1152 Encounter for screening for COVID-19: Secondary | ICD-10-CM | POA: Insufficient documentation

## 2022-10-04 DIAGNOSIS — Z5321 Procedure and treatment not carried out due to patient leaving prior to being seen by health care provider: Secondary | ICD-10-CM | POA: Insufficient documentation

## 2022-10-04 DIAGNOSIS — D72829 Elevated white blood cell count, unspecified: Secondary | ICD-10-CM | POA: Insufficient documentation

## 2022-10-04 DIAGNOSIS — E876 Hypokalemia: Secondary | ICD-10-CM | POA: Insufficient documentation

## 2022-10-04 DIAGNOSIS — F1914 Other psychoactive substance abuse with psychoactive substance-induced mood disorder: Secondary | ICD-10-CM | POA: Insufficient documentation

## 2022-10-04 DIAGNOSIS — F19939 Other psychoactive substance use, unspecified with withdrawal, unspecified: Secondary | ICD-10-CM | POA: Insufficient documentation

## 2022-10-04 LAB — CBC WITH DIFFERENTIAL/PLATELET
Abs Immature Granulocytes: 0.06 10*3/uL (ref 0.00–0.07)
Basophils Absolute: 0 10*3/uL (ref 0.0–0.1)
Basophils Relative: 0 %
Eosinophils Absolute: 0.1 10*3/uL (ref 0.0–0.5)
Eosinophils Relative: 1 %
HCT: 27.6 % — ABNORMAL LOW (ref 36.0–46.0)
Hemoglobin: 8.1 g/dL — ABNORMAL LOW (ref 12.0–15.0)
Immature Granulocytes: 1 %
Lymphocytes Relative: 21 %
Lymphs Abs: 2.5 10*3/uL (ref 0.7–4.0)
MCH: 20.7 pg — ABNORMAL LOW (ref 26.0–34.0)
MCHC: 29.3 g/dL — ABNORMAL LOW (ref 30.0–36.0)
MCV: 70.6 fL — ABNORMAL LOW (ref 80.0–100.0)
Monocytes Absolute: 0.6 10*3/uL (ref 0.1–1.0)
Monocytes Relative: 5 %
Neutro Abs: 8.3 10*3/uL — ABNORMAL HIGH (ref 1.7–7.7)
Neutrophils Relative %: 72 %
Platelets: 521 10*3/uL — ABNORMAL HIGH (ref 150–400)
RBC: 3.91 MIL/uL (ref 3.87–5.11)
RDW: 21.2 % — ABNORMAL HIGH (ref 11.5–15.5)
WBC: 11.5 10*3/uL — ABNORMAL HIGH (ref 4.0–10.5)
nRBC: 0 % (ref 0.0–0.2)

## 2022-10-04 LAB — URINALYSIS, ROUTINE W REFLEX MICROSCOPIC
Bacteria, UA: NONE SEEN
Bilirubin Urine: NEGATIVE
Glucose, UA: NEGATIVE mg/dL
Hgb urine dipstick: NEGATIVE
Ketones, ur: NEGATIVE mg/dL
Leukocytes,Ua: NEGATIVE
Nitrite: NEGATIVE
Protein, ur: 30 mg/dL — AB
Specific Gravity, Urine: 1.028 (ref 1.005–1.030)
pH: 6 (ref 5.0–8.0)

## 2022-10-04 LAB — COMPREHENSIVE METABOLIC PANEL
ALT: 34 U/L (ref 0–44)
AST: 33 U/L (ref 15–41)
Albumin: 3.4 g/dL — ABNORMAL LOW (ref 3.5–5.0)
Alkaline Phosphatase: 59 U/L (ref 38–126)
Anion gap: 7 (ref 5–15)
BUN: 11 mg/dL (ref 6–20)
CO2: 22 mmol/L (ref 22–32)
Calcium: 8.9 mg/dL (ref 8.9–10.3)
Chloride: 112 mmol/L — ABNORMAL HIGH (ref 98–111)
Creatinine, Ser: 0.69 mg/dL (ref 0.44–1.00)
GFR, Estimated: >60 mL/min (ref >60)
Glucose, Bld: 107 mg/dL — ABNORMAL HIGH (ref 70–99)
Potassium: 3.4 mmol/L — ABNORMAL LOW (ref 3.5–5.1)
Sodium: 141 mmol/L (ref 135–145)
Total Bilirubin: 0.5 mg/dL (ref 0.3–1.2)
Total Protein: 7.4 g/dL (ref 6.5–8.1)

## 2022-10-04 LAB — RAPID URINE DRUG SCREEN, HOSP PERFORMED
Amphetamines: POSITIVE — AB
Barbiturates: NOT DETECTED
Benzodiazepines: NOT DETECTED
Cocaine: POSITIVE — AB
Opiates: NOT DETECTED
Tetrahydrocannabinol: POSITIVE — AB

## 2022-10-04 LAB — ETHANOL: Alcohol, Ethyl (B): <10 mg/dL (ref <10)

## 2022-10-04 LAB — RESP PANEL BY RT-PCR (FLU A&B, COVID) ARPGX2
Influenza A by PCR: NEGATIVE
Influenza B by PCR: NEGATIVE
SARS Coronavirus 2 by RT PCR: NEGATIVE

## 2022-10-04 LAB — I-STAT BETA HCG BLOOD, ED (MC, WL, AP ONLY): I-stat hCG, quantitative: <5 m[IU]/mL (ref <5)

## 2022-10-04 LAB — SALICYLATE LEVEL: Salicylate Lvl: <7 mg/dL — ABNORMAL LOW (ref 7.0–30.0)

## 2022-10-04 LAB — ACETAMINOPHEN LEVEL: Acetaminophen (Tylenol), Serum: <10 ug/mL — ABNORMAL LOW (ref 10–30)

## 2022-10-04 NOTE — ED Provider Triage Note (Signed)
Emergency Medicine Provider Triage Evaluation Note  Donna Zamora , a 36 y.o. female  was evaluated in triage.  Pt complains of needing detox.  Review of Systems  Positive:  Negative:   Physical Exam  BP 132/88 (BP Location: Right Arm)   Pulse 94   Temp 98.3 F (36.8 C) (Oral)   Resp (!) 24   Ht 5\' 5"  (1.651 m)   Wt 77.1 kg   SpO2 98%   BMI 28.29 kg/m  Gen:   Awake, no distress   Resp:  Normal effort  MSK:   Moves extremities without difficulty  Other:    Medical Decision Making  Medically screening exam initiated at 6:15 PM.  Appropriate orders placed.  Donna Zamora was informed that the remainder of the evaluation will be completed by another provider, this initial triage assessment does not replace that evaluation, and the importance of remaining in the ED until their evaluation is complete.  Patient states she Was Told at [facility] As Inpatient Detox.  I Explained to Patient Detox Program at This Time and Offered Outpatient Resources and the Patient Became Upset and Left the [facility]   Dorothyann Peng, [place] 10/04/22 1815

## 2022-10-04 NOTE — ED Provider Triage Note (Signed)
Emergency Medicine Provider Triage Evaluation Note  Donna Zamora , a 36 y.o. female  was evaluated in triage.  Pt complains of suicidal ideations with auditory and visual hallucinations.  Patient states she plans to kill herself by overdose.  Of note patient was here at the same facility approximately 2 hours ago requesting inpatient detox for multiple different drugs.  Patient eloped at that time  Review of Systems  Positive: As above Negative: As above  Physical Exam  There were no vitals taken for this visit. Gen:   Awake, no distress   Resp:  Normal effort  MSK:   Moves extremities without difficulty  Other:    Medical Decision Making  Medically screening exam initiated at 8:32 PM.  Appropriate orders placed.  Donna Zamora was informed that the remainder of the evaluation will be completed by another provider, this initial triage assessment does not replace that evaluation, and the importance of remaining in the ED until their evaluation is complete.     Dorothyann Peng, PA-C 10/04/22 2032

## 2022-10-04 NOTE — ED Triage Notes (Signed)
Patient reports suicidal ideation plans to overdose on drugs with auditory and visual hallucinations .

## 2022-10-04 NOTE — ED Triage Notes (Signed)
Pt states she needs inpt rehab and uses "all the drugs." Last used 2 days ago. Denies any complaints.

## 2022-10-05 ENCOUNTER — Other Ambulatory Visit (HOSPITAL_COMMUNITY)
Admission: EM | Admit: 2022-10-05 | Discharge: 2022-10-08 | Disposition: A | Discharge status: Home / Self Care | Payer: No Payment, Other | Attending: Psychiatry | Admitting: Psychiatry

## 2022-10-05 ENCOUNTER — Encounter (HOSPITAL_COMMUNITY): Payer: Self-pay | Admitting: Emergency Medicine

## 2022-10-05 DIAGNOSIS — R45851 Suicidal ideations: Secondary | ICD-10-CM

## 2022-10-05 DIAGNOSIS — Z79899 Other long term (current) drug therapy: Secondary | ICD-10-CM | POA: Insufficient documentation

## 2022-10-05 DIAGNOSIS — R443 Hallucinations, unspecified: Secondary | ICD-10-CM | POA: Insufficient documentation

## 2022-10-05 DIAGNOSIS — F4481 Dissociative identity disorder: Secondary | ICD-10-CM | POA: Insufficient documentation

## 2022-10-05 DIAGNOSIS — F1994 Other psychoactive substance use, unspecified with psychoactive substance-induced mood disorder: Secondary | ICD-10-CM

## 2022-10-05 DIAGNOSIS — Z9151 Personal history of suicidal behavior: Secondary | ICD-10-CM | POA: Insufficient documentation

## 2022-10-05 DIAGNOSIS — F32A Depression, unspecified: Secondary | ICD-10-CM | POA: Insufficient documentation

## 2022-10-05 DIAGNOSIS — R609 Edema, unspecified: Secondary | ICD-10-CM | POA: Insufficient documentation

## 2022-10-05 DIAGNOSIS — Z5900 Homelessness unspecified: Secondary | ICD-10-CM | POA: Insufficient documentation

## 2022-10-05 DIAGNOSIS — F4310 Post-traumatic stress disorder, unspecified: Secondary | ICD-10-CM | POA: Insufficient documentation

## 2022-10-05 DIAGNOSIS — F1590 Other stimulant use, unspecified, uncomplicated: Secondary | ICD-10-CM | POA: Insufficient documentation

## 2022-10-05 DIAGNOSIS — R202 Paresthesia of skin: Secondary | ICD-10-CM | POA: Insufficient documentation

## 2022-10-05 DIAGNOSIS — Z8619 Personal history of other infectious and parasitic diseases: Secondary | ICD-10-CM | POA: Insufficient documentation

## 2022-10-05 DIAGNOSIS — F1410 Cocaine abuse, uncomplicated: Secondary | ICD-10-CM | POA: Insufficient documentation

## 2022-10-05 DIAGNOSIS — F1010 Alcohol abuse, uncomplicated: Secondary | ICD-10-CM | POA: Insufficient documentation

## 2022-10-05 DIAGNOSIS — F17210 Nicotine dependence, cigarettes, uncomplicated: Secondary | ICD-10-CM | POA: Insufficient documentation

## 2022-10-05 DIAGNOSIS — H5712 Ocular pain, left eye: Secondary | ICD-10-CM | POA: Insufficient documentation

## 2022-10-05 DIAGNOSIS — R21 Rash and other nonspecific skin eruption: Secondary | ICD-10-CM | POA: Insufficient documentation

## 2022-10-05 DIAGNOSIS — F1510 Other stimulant abuse, uncomplicated: Secondary | ICD-10-CM

## 2022-10-05 MED ORDER — IBUPROFEN 600 MG PO TABS
600.0000 mg | ORAL_TABLET | Freq: Three times a day (TID) | ORAL | Status: DC | PRN
  Administered 2022-10-05: 600 mg via ORAL
  Filled 2022-10-05: qty 1

## 2022-10-05 MED ORDER — ACETAMINOPHEN 325 MG PO TABS: 650.0000 mg | ORAL_TABLET | Freq: Four times a day (QID) | ORAL | Status: DC | PRN

## 2022-10-05 MED ORDER — LOPERAMIDE HCL 2 MG PO CAPS: 2.0000 mg | ORAL_CAPSULE | ORAL | Status: AC | PRN

## 2022-10-05 MED ORDER — CHLORDIAZEPOXIDE HCL 25 MG PO CAPS: 25.0000 mg | ORAL_CAPSULE | Freq: Four times a day (QID) | ORAL | Status: AC | PRN

## 2022-10-05 MED ORDER — THIAMINE HCL 100 MG/ML IJ SOLN: 100.0000 mg | Freq: Once | INTRAMUSCULAR | Status: DC

## 2022-10-05 MED ORDER — HYDROXYZINE HCL 25 MG PO TABS
25.0000 mg | ORAL_TABLET | Freq: Three times a day (TID) | ORAL | Status: DC | PRN
  Administered 2022-10-05 – 2022-10-06 (×2): 25 mg via ORAL
  Filled 2022-10-05 (×2): qty 1

## 2022-10-05 MED ORDER — DIPHENHYDRAMINE HCL 50 MG PO CAPS
50.0000 mg | ORAL_CAPSULE | Freq: Once | ORAL | Status: AC
  Administered 2022-10-05: 50 mg via ORAL
  Filled 2022-10-05: qty 1

## 2022-10-05 MED ORDER — ALUM & MAG HYDROXIDE-SIMETH 200-200-20 MG/5ML PO SUSP: 30.0000 mL | ORAL | Status: DC | PRN

## 2022-10-05 MED ORDER — ONDANSETRON 4 MG PO TBDP: 4.0000 mg | ORAL_TABLET | Freq: Four times a day (QID) | ORAL | Status: AC | PRN

## 2022-10-05 MED ORDER — MAGNESIUM HYDROXIDE 400 MG/5ML PO SUSP: 30.0000 mL | Freq: Every day | ORAL | Status: DC | PRN

## 2022-10-05 MED ORDER — ADULT MULTIVITAMIN W/MINERALS CH
1.0000 | ORAL_TABLET | Freq: Every day | ORAL | Status: DC
  Administered 2022-10-05 – 2022-10-08 (×4): 1 via ORAL
  Filled 2022-10-05 (×4): qty 1

## 2022-10-05 MED ORDER — TRAZODONE HCL 50 MG PO TABS
50.0000 mg | ORAL_TABLET | Freq: Every evening | ORAL | Status: DC | PRN
  Administered 2022-10-05 – 2022-10-07 (×3): 50 mg via ORAL
  Filled 2022-10-05 (×3): qty 1

## 2022-10-05 NOTE — ED Notes (Signed)
Patient given food and shower items to shower with.

## 2022-10-05 NOTE — ED Provider Notes (Signed)
[facility] Provider Note   CSN: [id number] Arrival date & time: 10/04/22  1824     History  Chief Complaint  Patient presents with   Suicidal    Donna Zamora is a 36 y.o. female.  HPI 35F complains of suicidal ideations with auditory and visual hallucinations.  Patient states she plans to kill herself by overdose.  Of note patient was here at the same facility approximately 2 hours ago requesting inpatient detox for multiple different drugs.  Patient eloped at that time    Home Medications Prior to Admission medications   Medication Sig Start Date End Date Taking? Authorizing Provider  doxycycline (VIBRAMYCIN) 100 MG capsule Take 1 capsule (100 mg total) by mouth 2 (two) times daily. 11/14/20   Horton, Barbette Hair, MD  ibuprofen (ADVIL,MOTRIN) 800 MG tablet Take 800 mg by mouth every 8 (eight) hours as needed. Patient used this medication for her abdominal pain.    [provider]      Allergies    Penicillins and Risperidone and related    Review of Systems   Review of Systems Ten systems reviewed and are negative for acute change, except as noted in the HPI.   Physical Exam Updated Vital Signs BP 136/79 (BP Location: Right Arm)   Pulse 93   Temp 98.1 F (36.7 C) (Oral)   Resp 19   SpO2 100%  Physical Exam Vitals and nursing note reviewed.  Constitutional:      General: She is not in acute distress.    Appearance: She is well-developed.  HENT:     Head: Normocephalic and atraumatic.  Eyes:     Conjunctiva/sclera: Conjunctivae normal.  Cardiovascular:     Rate and Rhythm: Normal rate and regular rhythm.     Heart sounds: No murmur heard. Pulmonary:     Effort: Pulmonary effort is normal. No respiratory distress.     Breath sounds: Normal breath sounds.  Abdominal:     Palpations: Abdomen is soft.     Tenderness: There is no abdominal tenderness.  Musculoskeletal:        General: No swelling.     Cervical  back: Neck supple.  Skin:    General: Skin is warm and dry.     Capillary Refill: Capillary refill takes less than 2 seconds.  Neurological:     Mental Status: She is alert.  Psychiatric:        Mood and Affect: Mood normal.     ED Results / Procedures / Treatments   Labs (all labs ordered are listed, but only abnormal results are displayed) Labs Reviewed  COMPREHENSIVE METABOLIC PANEL - Abnormal; Notable for the following components:      Result Value   Potassium 3.4 (*)    Chloride 112 (*)    Glucose, Bld 107 (*)    Albumin 3.4 (*)    All other components within normal limits  RAPID URINE DRUG SCREEN, HOSP PERFORMED - Abnormal; Notable for the following components:   Cocaine POSITIVE (*)    Amphetamines POSITIVE (*)    Tetrahydrocannabinol POSITIVE (*)    All other components within normal limits  CBC WITH DIFFERENTIAL/PLATELET - Abnormal; Notable for the following components:   WBC 11.5 (*)    Hemoglobin 8.1 (*)    HCT 27.6 (*)    MCV 70.6 (*)    MCH 20.7 (*)    MCHC 29.3 (*)    RDW 21.2 (*)    Platelets 521 (*)  Neutro Abs 8.3 (*)    All other components within normal limits  SALICYLATE LEVEL - Abnormal; Notable for the following components:   Salicylate Lvl <7.0 (*)    All other components within normal limits  ACETAMINOPHEN LEVEL - Abnormal; Notable for the following components:   Acetaminophen (Tylenol), Serum <10 (*)    All other components within normal limits  URINALYSIS, ROUTINE W REFLEX MICROSCOPIC - Abnormal; Notable for the following components:   APPearance HAZY (*)    Protein, ur 30 (*)    All other components within normal limits  RESP PANEL BY RT-PCR (FLU A&B, COVID) ARPGX2  ETHANOL  I-STAT BETA HCG BLOOD, ED ([facility], [facility], [facility] ONLY)    EKG None  Radiology No results found.  Procedures Procedures    Medications Ordered in ED Medications - No data to display  ED Course/ Medical Decision Making/ A&P                           Medical  Decision Making  Patient presents to the ER with complaints of SI, requiring medical current clearance for evaluation by psychiatry.  On presentation, the patient is calm, cooperative.  Vitals personally reviewed by me, overall reassuring.  I personally reviewed his lab work, which did not show any significant abnormalities.  CBC with mild leukocytosis, likely reactive.  Hgb 8.1, no active bleed.  CMP with mild hypokalemia, no other abnormalities, normal BUN/creatinine.  Negative acetaminophen, salicylate, ethanol.  UDS positive for amphetamines, THC, cocaine.  EKG without concerning changes.  She has been medically cleared for further evaluation by TTS.  Dispo according to the recommendation.  Final Clinical Impression(s) / ED Diagnoses Final diagnoses:  Suicidal ideation    Rx / DC Orders ED Discharge Orders     None         Mare Ferrari, PA-C 10/05/22 0401    Tilden Fossa, MD 10/05/22 [phone]

## 2022-10-05 NOTE — ED Notes (Signed)
Pt sleeping at present, no distress noted.  Monitoring for safety. 

## 2022-10-05 NOTE — ED Notes (Signed)
Pt A&O x 4,  awake & resting at present, no distress noted, calm & cooperative at present.  Monitoring for safety.

## 2022-10-05 NOTE — ED Notes (Signed)
Pt just arrived to the unit, and was give sandwich and 2 juices

## 2022-10-05 NOTE — ED Provider Notes (Signed)
[facility] Continuous Assessment Admission H&P  Date: 10/05/22 Patient Name: Donna Zamora MRN: [id number] Chief Complaint: No chief complaint on file.     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)  Cocaine abuse ([place])  Alcohol abuse    HPI: Donna Zamora is a 36 year old female patient who initially presented to the [facility] emergency department with complaints of suicidal ideations with a plan to overdose and requested inpatient substance abuse treatment. Patient was evaluated by this provider at the [facility] emergency department and recommended for continuous assessment the [facility] for overnight observation. Patient be reevaluated on 10/06/22 and considered for the facility based crisis unit for substance abuse treatment and mood stabilization if no active suicidal ideations on reevaluation. Patient is also interested in long-term residential substance abuse treatment. She currently denies SI/HI/AVH. There is no objective evidence that she is responding to internal or external stimuli. She reports feeling depressed and states that she does not feel pretty or good enough. She is noted to be sad and tearful throughout the assessment. She c/o left eye pain. She denies blurred vision or drainage. Edema noted to left eye. No drainage or discharge noted to left eye. She c/o of a rash to her mouth and asked if she has herpes. She states that she woke up this morning with a rash on her mouth. She denies a history of herpes. She is agreeable to be screened for STD's. She does not recall applying any lotions or creams to her face today. She does not recall eating anything this morning that may have caused an allergic reaction. Patient denies alcohol withdrawal symptoms.    [facility] Consult Assessment note from [facility] by this provider. Patient endorses suicidal ideations with a plan to "try to overdose." She reports access to drugs,  "heroin."  She reports having suicidal thoughts for a couple days. She reports past suicide attempts, "a few times, 10 years ago. She denies HI. She currently denies AVH. She reports experiencing hallucinations yesterday while using drugs. She reports improved sleep while being in the hospital. She states that prior to staying in the hospital she did not sleep for 14 days. She reports a fair appetite. She states that her goal for treatment is inpatient substance abuse treatment. She verbally contracts for safety.    She reports using all drugs. She reports using crack cocaine, ecstasy, PCP, heroin, and meth to kill herself.  She reports using crack for the past 5 years, on average every day, a couple 100s of dollars' worth, and states that she last used yesterday. She states that she uses ecstasy when she is depressed and that she last used a couple days ago. She states that she rarely uses PCP and that she last used a couple days ago. She states that she rarely uses IV heroin, and states that she last used a couple days ago to kill herself. She states that she rarely uses meth and states that she used an unknown amount a couple days ago. Her UDS is positive for amphetamines, THC, and cocaine. She reports occasional alcohol use, on average she drinks a couple times per week and states that she last drank alcohol a couple days ago. BAL on arrival was negative. She reports drinking alcohol for a couple years. She denies alcohol withdrawal DTs or seizures. She denies active withdrawal symptoms. Patient is currently homeless and lives between her family and friends houses. She is unemployed. She  denies outpatient psychiatric services. She denies taking prescribed medications for mental health. She states that in the past she was prescribed Depakote, lithium, Paxil, and Seroquel. She is unable to recall the last time she took the stated medications. She [facility] [place] a couple months  ago.     PHQ 2-9:   Flow[facility] Most recent reading at 10/04/2022  8:24 PM [facility] Most recent reading at 10/04/2022  5:55 PM  C-SSRS RISK CATEGORY High Risk No Risk        Total Time spent with patient: 30 minutes  Musculoskeletal  Strength & Muscle Tone: within normal limits Gait & Station: normal Patient leans: N/A  Psychiatric Specialty Exam  Presentation General Appearance:  Disheveled  Eye Contact: Minimal  Speech: Clear and Coherent  Speech Volume: Decreased  Handedness: Right   Mood and Affect  Mood: Dysphoric  Affect: Congruent   Thought Process  Thought Processes: Coherent; Goal Directed  Descriptions of Associations:Intact  Orientation:Full (Time, Place and Person)  Thought Content:Logical    Hallucinations:Hallucinations: None  Ideas of Reference:None  Suicidal Thoughts:Suicidal Thoughts: Yes, Active  Homicidal Thoughts:Homicidal Thoughts: No   Sensorium  Memory: Immediate Fair; Recent Fair; Remote Fair  Judgment: Intact  Insight: Present   Executive Functions  Concentration: Fair  Attention Span: Fair  Recall: FiservFair  Fund of Knowledge: Fair  Language: Fair   Psychomotor Activity  Psychomotor Activity: Psychomotor Activity: Normal   Assets  Assets: Communication Skills; Desire for Improvement; Physical Health   Sleep  Sleep: Sleep: Fair   Physical Exam HENT:     Nose: Nose normal.     Mouth/Throat:     Comments: Perioral dermatitis  Eyes:     Extraocular Movements: Extraocular movements intact.     Comments: Left eye edema, no drainage or discharge noted.   Cardiovascular:     Rate and Rhythm: Normal rate.  Pulmonary:     Effort: Pulmonary effort is normal.  Musculoskeletal:        General: Normal range of motion.     Cervical back: Normal range of  motion.  Neurological:     Mental Status: She is alert and oriented to person, place, and time.    Review of Systems  Constitutional: Negative.   HENT: Negative.    Eyes:  Positive for pain. Negative for blurred vision and discharge.       Left eye swollen  Respiratory: Negative.    Cardiovascular: Negative.   Gastrointestinal: Negative.   Genitourinary: Negative.   Musculoskeletal: Negative.   Skin:  Positive for rash.       C/o rash to mouth   Neurological: Negative.   Endo/Heme/Allergies: Negative.    Past Psychiatric History: A self-reported psychiatric history of substance abuse, schizophrenia, multiple personalities, depression and PTSD. She reports "a whole lot" of inpatient psychiatric hospitalizations for mental health. Last reported inpatient psychiatric hospitalization was a couple years ago. Past medications includes Seroquel, Depakote, Paxil and Lithium.   Is the patient at risk to self? Yes  Has the patient been a risk to self in the past 6 months? No .    Has the patient been a risk to self within the distant past? Yes   Is the patient a risk to others? No   Has the patient been a risk to others in the past 6 months? No   Has  the patient been a risk to others within the distant past? No   Past Medical History:  Past Medical History:  Diagnosis Date   Ectopic pregnancy     Past Surgical History:  Procedure Laterality Date   CESAREAN SECTION      Family History: No family history on file.  Social History:  Social History   Socioeconomic History   Marital status: Single    Spouse name: Not on file   Number of children: Not on file   Years of education: Not on file   Highest education level: Not on file  Occupational History   Not on file  Tobacco Use   Smoking status: Every Day    Packs/day: 0.50    Types: Cigarettes   Smokeless tobacco: Never  Substance and Sexual Activity   Alcohol use: No   Drug use: No   Sexual activity: Yes    Birth  control/protection: None  Other Topics Concern   Not on file  Social History Narrative   Not on file   Social Determinants of Health   Financial Resource Strain: Not on file  Food Insecurity: Not on file  Transportation Needs: Not on file  Physical Activity: Not on file  Stress: Not on file  Social Connections: Not on file  Intimate Partner Violence: Not on file    SDOH:  SDOH Screenings   Tobacco Use: High Risk (10/04/2022)    Last Labs:  Admission on 10/04/2022, Discharged on 10/05/2022  Component Date Value Ref Range Status   SARS Coronavirus 2 by RT PCR 10/04/2022 NEGATIVE  NEGATIVE Final   Comment: (NOTE) SARS-CoV-2 target nucleic acids are NOT DETECTED.  The SARS-CoV-2 RNA is generally detectable in upper respiratory specimens during the acute phase of infection. The lowest concentration of SARS-CoV-2 viral copies this assay can detect is 138 copies/mL. A negative result does not preclude SARS-Cov-2 infection and should not be used as the sole basis for treatment or other patient management decisions. A negative result may occur with  improper specimen collection/handling, submission of specimen other than nasopharyngeal swab, presence of viral mutation(s) within the areas targeted by this assay, and inadequate number of viral copies(<138 copies/mL). A negative result must be combined with clinical observations, patient history, and epidemiological information. The expected result is Negative.  Fact Sheet for Patients:  BloggerCourse.com  Fact Sheet for Healthcare Providers:  SeriousBroker.it  This test is no                          t yet approved or cleared by the [place] FDA and  has been authorized for detection and/or diagnosis of SARS-CoV-2 by FDA under an Emergency Use Authorization (EUA). This EUA will remain  in effect (meaning this test can be used) for the duration of the COVID-19 declaration  under Section 564(b)(1) of the Act, 21 U.S.C.section 360bbb-3(b)(1), unless the authorization is terminated  or revoked sooner.       Influenza A by PCR 10/04/2022 NEGATIVE  NEGATIVE Final   Influenza B by PCR 10/04/2022 NEGATIVE  NEGATIVE Final   Comment: (NOTE) The Xpert Xpress SARS-CoV-2/FLU/RSV plus assay is intended as an aid in the diagnosis of influenza from Nasopharyngeal swab specimens and should not be used as a sole basis for treatment. Nasal washings and aspirates are unacceptable for Xpert Xpress SARS-CoV-2/FLU/RSV testing.  Fact Sheet for Patients: BloggerCourse.com  Fact Sheet for Healthcare Providers: SeriousBroker.it  This test is not yet approved  or cleared by the [place] and has been authorized for detection and/or diagnosis of SARS-CoV-2 by FDA under an Emergency Use Authorization (EUA). This EUA will remain in effect (meaning this test can be used) for the duration of the COVID-19 declaration under Section 564(b)(1) of the Act, 21 U.S.C. section 360bbb-3(b)(1), unless the authorization is terminated or revoked.  Performed at [facility], [address], [place], [place] [address]    Sodium 10/04/2022 141  135 - 145 mmol/L Final   Potassium 10/04/2022 3.4 (L)  3.5 - 5.1 mmol/L Final   Chloride 10/04/2022 112 (H)  98 - 111 mmol/L Final   CO2 10/04/2022 22  22 - 32 mmol/L Final   Glucose, Bld 10/04/2022 107 (H)  70 - 99 mg/dL Final   Glucose reference range applies only to samples taken after fasting for at least 8 hours.   BUN 10/04/2022 11  6 - 20 mg/dL Final   Creatinine, Ser 10/04/2022 0.69  0.44 - 1.00 mg/dL Final   Calcium 29/92/4268 8.9  8.9 - 10.3 mg/dL Final   Total Protein 34/19/6222 7.4  6.5 - 8.1 g/dL Final   Albumin 97/98/9211 3.4 (L)  3.5 - 5.0 g/dL Final   AST 94/17/4081 33  15 - 41 U/L Final   ALT 10/04/2022 34  0 - 44 U/L Final   Alkaline Phosphatase 10/04/2022 59  38 -  126 U/L Final   Total Bilirubin 10/04/2022 0.5  0.3 - 1.2 mg/dL Final   GFR, Estimated 10/04/2022 >60  >60 mL/min Final   Comment: (NOTE) Calculated using the CKD-EPI Creatinine Equation (2021)    Anion gap 10/04/2022 7  5 - 15 Final   Performed at [facility], [address] [place], [place] [address]   Alcohol, Ethyl (B) 10/04/2022 <10  <10 mg/dL Final   Comment: (NOTE) Lowest detectable limit for serum alcohol is 10 mg/dL.  For medical purposes only. Performed at [facility], [address], [place], [place] [address]    Opiates 10/04/2022 NONE DETECTED  NONE DETECTED Final   Cocaine 10/04/2022 POSITIVE (A)  NONE DETECTED Final   Benzodiazepines 10/04/2022 NONE DETECTED  NONE DETECTED Final   Amphetamines 10/04/2022 POSITIVE (A)  NONE DETECTED Final   Tetrahydrocannabinol 10/04/2022 POSITIVE (A)  NONE DETECTED Final   Barbiturates 10/04/2022 NONE DETECTED  NONE DETECTED Final   Comment: (NOTE) DRUG SCREEN FOR MEDICAL PURPOSES ONLY.  IF CONFIRMATION IS NEEDED FOR ANY PURPOSE, NOTIFY LAB WITHIN 5 DAYS.  LOWEST DETECTABLE LIMITS FOR URINE DRUG SCREEN Drug Class                     Cutoff (ng/mL) Amphetamine and metabolites    1000 Barbiturate and metabolites    200 Benzodiazepine                 200 Opiates and metabolites        300 Cocaine and metabolites        300 THC                            50 Performed at [facility], [address] [place], [place] [address]    WBC 10/04/2022 11.5 (H)  4.0 - 10.5 K/uL Final   RBC 10/04/2022 3.91  3.87 - 5.11 MIL/uL Final   Hemoglobin 10/04/2022 8.1 (L)  12.0 - 15.0 g/dL Final   Comment: Reticulocyte Hemoglobin  testing may be clinically indicated, consider ordering this additional test [id number]    HCT 10/04/2022 27.6 (L)  36.0 - 46.0 % Final   MCV 10/04/2022 70.6 (L)  80.0 - 100.0 fL Final   MCH 10/04/2022 20.7 (L)  26.0 - 34.0 pg Final   MCHC 10/04/2022 29.3 (L)  30.0 - 36.0 g/dL Final   RDW  25/00/3704 21.2 (H)  11.5 - 15.5 % Final   Platelets 10/04/2022 521 (H)  150 - 400 K/uL Final   nRBC 10/04/2022 0.0  0.0 - 0.2 % Final   Neutrophils Relative % 10/04/2022 72  % Final   Neutro Abs 10/04/2022 8.3 (H)  1.7 - 7.7 K/uL Final   Lymphocytes Relative 10/04/2022 21  % Final   Lymphs Abs 10/04/2022 2.5  0.7 - 4.0 K/uL Final   Monocytes Relative 10/04/2022 5  % Final   Monocytes Absolute 10/04/2022 0.6  0.1 - 1.0 K/uL Final   Eosinophils Relative 10/04/2022 1  % Final   Eosinophils Absolute 10/04/2022 0.1  0.0 - 0.5 K/uL Final   Basophils Relative 10/04/2022 0  % Final   Basophils Absolute 10/04/2022 0.0  0.0 - 0.1 K/uL Final   Immature Granulocytes 10/04/2022 1  % Final   Abs Immature Granulocytes 10/04/2022 0.06  0.00 - 0.07 K/uL Final   Performed at [facility], [address], [place], [place] [address]   I-stat hCG, quantitative 10/04/2022 <5.0  <5 mIU/mL Final   Comment 3 10/04/2022          Final   Comment:   GEST. AGE      CONC.  (mIU/mL)   <=1 WEEK        5 - 50     2 WEEKS       50 - 500     3 WEEKS       100 - 10,000     4 WEEKS     1,000 - 30,000        FEMALE AND NON-PREGNANT FEMALE:     LESS THAN 5 mIU/mL    Salicylate Lvl 10/04/2022 <7.0 (L)  7.0 - 30.0 mg/dL Final   Performed at [facility], [address], [place], [place] [address]   Acetaminophen (Tylenol), Serum 10/04/2022 <10 (L)  10 - 30 ug/mL Final   Comment: (NOTE) Therapeutic concentrations vary significantly. A range of 10-30 ug/mL  may be an effective concentration for many patients. However, some  are best treated at concentrations outside of this range. Acetaminophen concentrations >150 ug/mL at 4 hours after ingestion  and >50 ug/mL at 12 hours after ingestion are often associated with  toxic reactions.  Performed at [facility], [address], [place], [place] [address]    Color, Urine 10/04/2022 YELLOW  YELLOW Final   APPearance 10/04/2022 HAZY (A)  CLEAR Final    Specific Gravity, Urine 10/04/2022 1.028  1.005 - 1.030 Final   pH 10/04/2022 6.0  5.0 - 8.0 Final   Glucose, UA 10/04/2022 NEGATIVE  NEGATIVE mg/dL Final   Hgb urine dipstick 10/04/2022 NEGATIVE  NEGATIVE Final   Bilirubin Urine 10/04/2022 NEGATIVE  NEGATIVE Final   Ketones, ur 10/04/2022 NEGATIVE  NEGATIVE mg/dL Final   Protein, ur 80/02/4916 30 (A)  NEGATIVE mg/dL Final   Nitrite 91/50/5697 NEGATIVE  NEGATIVE Final   Leukocytes,Ua 10/04/2022 NEGATIVE  NEGATIVE Final   RBC / HPF 10/04/2022 0-5  0 - 5 RBC/hpf Final   WBC, UA 10/04/2022 0-5  0 - 5 WBC/hpf Final   Bacteria, UA 10/04/2022 NONE SEEN  NONE SEEN Final   Squamous Epithelial / LPF 10/04/2022 6-10  0 - 5 Final   Mucus 10/04/2022 PRESENT   Final   Performed at [facility], [address], [place], [place] [address]    Allergies: Penicillins and Risperidone and related  Medical Decision Making  Patient is voluntary. Patient admitted to the [facility] continuous assessment unit for overnight observation for mood stabilization and substance abuse treatment. Patient to be considered for [facility] on 10/06/22 if not active SI. Consulted with Dr. Cyndie Chime and patient assessed for medical complaints. No concerns for infection. No antibiotics required.   Labs Lab Orders         TSH         Lipid panel         Hemoglobin A1c         RPR         HIV Antibody (routine testing w rflx)    Repeat EKG on 10/06/22. Prolonged QTC on 10/05/22. GC/Chlamydia urine  Medications:  Benadryl 50 mg po once for possible allergic contact dermatitis to mouth Add trazodone 50 mg po QHS prn for sleep Add Vistaril 25 mg po TID prn for anxiety Add librium 25 mg po prn for ciwa greater than 10 Add thiamine and multivitamin po daily  Consider adding Seroquel for mood stabilization if QTC normalizes.     Recommendations  Based on my evaluation the patient does not appear to have an emergency medical condition.  Layla Barter, NP 10/05/22  4:23 PM

## 2022-10-05 NOTE — ED Notes (Signed)
Arrived to unit via safe transport. Patient irritable on admission due to not being able to go back outside to smoke a cigarette- also very irritated about the security, locked doors, skin check, and search process. Provided verbal support to patient. Upon admission patients left eye is noted to have some swelling under it. ED provider looked at it and said it was fine but eye is notably swollen. Darrol Angel NP notified.

## 2022-10-05 NOTE — Consult Note (Signed)
[facility] Face-to-Face Psychiatry Consult   Reason for Consult: Psych consult  Referring Physician: Pamala Duffel Patient Identification: Donna Zamora MRN:  [id number] Principal Diagnosis: Suicidal ideation Diagnosis:  Principal Problem:   Suicidal ideation Active Problems:   Substance induced mood disorder (HCC)   Total Time spent with patient: 20 minutes  Subjective:   Donna Zamora is a 36 y.o. female patient admitted to the [facility] voluntarily with complaints of SI with a plan to overdose and AVH.   HPI: Patient seen and evaluated face-to-face by this provider, chart reviewed and case discussed with Dr. Lucianne Muss. On evaluation, patient is alert and oriented x 4. Her thought process is logical and speech is clear and coherent. Her mood is dysphoric and affect is congruent. She has minimum eye contact. She appears disheveled. She is cooperative. There is no objective evidence that she is currently responding to internal or external stimuli, or experiencing delusional or paranoid thought content.  Patient endorses suicidal ideations with a plan to "try to overdose." She reports access to drugs, "heroin."  She reports having suicidal thoughts for a couple days. She reports past suicide attempts,"a few times, 10 years ago. She denies HI. She currently denies AVH. She reports experiencing hallucinations yesterday while using drugs. She reports improved sleep while being in the hospital. She states that prior to staying in the hospital she did not sleep for 14 days. She reports a fair appetite. She states that her goal for treatment is inpatient substance abuse treatment. She verbally contracts for safety.   She reports using all drugs. She reports using crack cocaine, ecstasy, PCP, heroin, and meth to kill herself.  She reports using crack for the past 5 years, on average everyday, a couple 100s of dollars worth, and states that she last used yesterday. She states that she uses ecstasy when she is  depressed and that she last used a couple days ago. She states that she rarely uses PCP and that she last used a couple days ago. She states that she rarely uses IV heroin, and states that she last used a couple days ago to kill herself. She states that she rarely uses meth and states that she used an unknown amount a couple days ago. Her UDS is positive for amphetamines, THC, and cocaine. She reports occasional alcohol use, on average she drinks a couple times per week and states that she last drank alcohol a couple days ago. BAL on arrival was negative.She reports drinking alcohol for a couple years. She denies alcohol withdrawal DTs or seizures. She denies active withdrawal symptoms. She c/o of left eye pain and nausea. Nursing staff informed of patient's medical compliants for EDP to assess.   Patient is currently homeless and lives between her family and friends houses. She is unemployed. She denies outpatient psychiatric services. She denies taking prescribed medications for mental health. She states that in the past she was prescribed Depakote, lithium, Paxil, and Seroquel. She is unable to recall the last time she took the stated medications. She reports detox treatment at [facility] in [place] a couple months ago.    Past Psychiatric History: A self reported psychiatric history of substance abuse, schizophrenia, multiple personalities, depression and PTSD. She reports "a whole lot" of inpatient psychiatric hospitalizations for mental health. Last reported inpatient psychiatric hospitalization was a couple years ago.   Risk to Self:  Yes  Risk to Others:  No Prior Inpatient Therapy:  Yes Prior Outpatient Therapy:  No  Past Medical History:  Past Medical History:  Diagnosis Date   Ectopic pregnancy     Past Surgical History:  Procedure Laterality Date   CESAREAN SECTION     Family History: No family history on file. Family Psychiatric  History: No history reported. Social History:   Social History   Substance and Sexual Activity  Alcohol Use No     Social History   Substance and Sexual Activity  Drug Use No    Social History   Socioeconomic History   Marital status: Single    Spouse name: Not on file   Number of children: Not on file   Years of education: Not on file   Highest education level: Not on file  Occupational History   Not on file  Tobacco Use   Smoking status: Every Day    Packs/day: 0.50    Types: Cigarettes   Smokeless tobacco: Never  Substance and Sexual Activity   Alcohol use: No   Drug use: No   Sexual activity: Yes    Birth control/protection: None  Other Topics Concern   Not on file  Social History Narrative   Not on file   Social Determinants of Health   Financial Resource Strain: Not on file  Food Insecurity: Not on file  Transportation Needs: Not on file  Physical Activity: Not on file  Stress: Not on file  Social Connections: Not on file   Additional Social History:    Allergies:   Allergies  Allergen Reactions   Penicillins Swelling   Risperidone And Related Other (See Comments)    Seizures    Labs:  Results for orders placed or performed during the hospital encounter of 10/04/22 (from the past 48 hour(s))  Resp Panel by RT-PCR (Flu A&B, Covid) Urine, Clean Catch     Status: None   Collection Time: 10/04/22  8:38 PM   Specimen: Urine, Clean Catch; Nasal Swab  Result Value Ref Range   SARS Coronavirus 2 by RT PCR NEGATIVE NEGATIVE    Comment: (NOTE) SARS-CoV-2 target nucleic acids are NOT DETECTED.  The SARS-CoV-2 RNA is generally detectable in upper respiratory specimens during the acute phase of infection. The lowest concentration of SARS-CoV-2 viral copies this assay can detect is 138 copies/mL. A negative result does not preclude SARS-Cov-2 infection and should not be used as the sole basis for treatment or other patient management decisions. A negative result may occur with  improper specimen  collection/handling, submission of specimen other than nasopharyngeal swab, presence of viral mutation(s) within the areas targeted by this assay, and inadequate number of viral copies(<138 copies/mL). A negative result must be combined with clinical observations, patient history, and epidemiological information. The expected result is Negative.  Fact Sheet for Patients:  BloggerCourse.comhttps://www.fda.gov/media/152166/download  Fact Sheet for Healthcare Providers:  SeriousBroker.ithttps://www.fda.gov/media/152162/download  This test is no t yet approved or cleared by the [place] FDA and  has been authorized for detection and/or diagnosis of SARS-CoV-2 by FDA under an Emergency Use Authorization (EUA). This EUA will remain  in effect (meaning this test can be used) for the duration of the COVID-19 declaration under Section 564(b)(1) of the Act, 21 U.S.C.section 360bbb-3(b)(1), unless the authorization is terminated  or revoked sooner.       Influenza A by PCR NEGATIVE NEGATIVE   Influenza B by PCR NEGATIVE NEGATIVE    Comment: (NOTE) The Xpert Xpress SARS-CoV-2/FLU/RSV plus assay is intended as an aid in the diagnosis of influenza from Nasopharyngeal swab specimens and should not be used  as a sole basis for treatment. Nasal washings and aspirates are unacceptable for Xpert Xpress SARS-CoV-2/FLU/RSV testing.  Fact Sheet for Patients: BloggerCourse.com  Fact Sheet for Healthcare Providers: SeriousBroker.it  This test is not yet approved or cleared by the [place] FDA and has been authorized for detection and/or diagnosis of SARS-CoV-2 by FDA under an Emergency Use Authorization (EUA). This EUA will remain in effect (meaning this test can be used) for the duration of the COVID-19 declaration under Section 564(b)(1) of the Act, 21 U.S.C. section 360bbb-3(b)(1), unless the authorization is terminated or revoked.  Performed at [facility], [address] [place], [place] [address]   Comprehensive metabolic panel     Status: Abnormal   Collection Time: 10/04/22  8:57 PM  Result Value Ref Range   Sodium 141 135 - 145 mmol/L   Potassium 3.4 (L) 3.5 - 5.1 mmol/L   Chloride 112 (H) 98 - 111 mmol/L   CO2 22 22 - 32 mmol/L   Glucose, Bld 107 (H) 70 - 99 mg/dL    Comment: Glucose reference range applies only to samples taken after fasting for at least 8 hours.   BUN 11 6 - 20 mg/dL   Creatinine, Ser 3.55 0.44 - 1.00 mg/dL   Calcium 8.9 8.9 - 73.2 mg/dL   Total Protein 7.4 6.5 - 8.1 g/dL   Albumin 3.4 (L) 3.5 - 5.0 g/dL   AST 33 15 - 41 U/L   ALT 34 0 - 44 U/L   Alkaline Phosphatase 59 38 - 126 U/L   Total Bilirubin 0.5 0.3 - 1.2 mg/dL   GFR, Estimated >20 >25 mL/min    Comment: (NOTE) Calculated using the CKD-EPI Creatinine Equation (2021)    Anion gap 7 5 - 15    Comment: Performed at [facility], [address] [place], [place] [address]  Ethanol     Status: None   Collection Time: 10/04/22  8:57 PM  Result Value Ref Range   Alcohol, Ethyl (B) <10 <10 mg/dL    Comment: (NOTE) Lowest detectable limit for serum alcohol is 10 mg/dL.  For medical purposes only. Performed at [facility], [address], [place], [place] [address]   CBC with Diff     Status: Abnormal   Collection Time: 10/04/22  8:57 PM  Result Value Ref Range   WBC 11.5 (H) 4.0 - 10.5 K/uL   RBC 3.91 3.87 - 5.11 MIL/uL   Hemoglobin 8.1 (L) 12.0 - 15.0 g/dL    Comment: Reticulocyte Hemoglobin testing may be clinically indicated, consider ordering this additional test [id number]    HCT 27.6 (L) 36.0 - 46.0 %   MCV 70.6 (L) 80.0 - 100.0 fL   MCH 20.7 (L) 26.0 - 34.0 pg   MCHC 29.3 (L) 30.0 - 36.0 g/dL   RDW 60.7 (H) 37.1 - 06.2 %   Platelets 521 (H) 150 - 400 K/uL   nRBC 0.0 0.0 - 0.2 %   Neutrophils Relative % 72 %   Neutro Abs 8.3 (H) 1.7 - 7.7 K/uL   Lymphocytes Relative 21 %   Lymphs Abs 2.5 0.7 - 4.0 K/uL   Monocytes  Relative 5 %   Monocytes Absolute 0.6 0.1 - 1.0 K/uL   Eosinophils Relative 1 %   Eosinophils Absolute 0.1 0.0 - 0.5 K/uL   Basophils Relative 0 %   Basophils Absolute 0.0 0.0 - 0.1 K/uL   Immature Granulocytes 1 %   Abs  Immature Granulocytes 0.06 0.00 - 0.07 K/uL    Comment: Performed at [facility], [place] [address], [place], [place] [address]  Salicylate level     Status: Abnormal   Collection Time: 10/04/22  8:57 PM  Result Value Ref Range   Salicylate Lvl <2.0 (L) 7.0 - 30.0 mg/dL    Comment: Performed at [facility] [address] [place], [place] [address]  Acetaminophen level     Status: Abnormal   Collection Time: 10/04/22  8:57 PM  Result Value Ref Range   Acetaminophen (Tylenol), Serum <10 (L) 10 - 30 ug/mL    Comment: (NOTE) Therapeutic concentrations vary significantly. A range of 10-30 ug/mL  may be an effective concentration for many patients. However, some  are best treated at concentrations outside of this range. Acetaminophen concentrations >150 ug/mL at 4 hours after ingestion  and >50 ug/mL at 12 hours after ingestion are often associated with  toxic reactions.  Performed at [facility], [place] [address], [place], [place] [address]   Urine rapid drug screen (hosp performed)     Status: Abnormal   Collection Time: 10/04/22  9:15 PM  Result Value Ref Range   Opiates NONE DETECTED NONE DETECTED   Cocaine POSITIVE (A) NONE DETECTED   Benzodiazepines NONE DETECTED NONE DETECTED   Amphetamines POSITIVE (A) NONE DETECTED   Tetrahydrocannabinol POSITIVE (A) NONE DETECTED   Barbiturates NONE DETECTED NONE DETECTED    Comment: (NOTE) DRUG SCREEN FOR MEDICAL PURPOSES ONLY.  IF CONFIRMATION IS NEEDED FOR ANY PURPOSE, NOTIFY LAB WITHIN 5 DAYS.  LOWEST DETECTABLE LIMITS FOR URINE DRUG SCREEN Drug Class                     Cutoff (ng/mL) Amphetamine and metabolites    1000 Barbiturate and metabolites    200 Benzodiazepine                  200 Opiates and metabolites        300 Cocaine and metabolites        300 THC                            50 Performed at [facility], [place] [address] [place], [place] [address]   Urinalysis, Routine w reflex microscopic Urine, Clean Catch     Status: Abnormal   Collection Time: 10/04/22  9:15 PM  Result Value Ref Range   Color, Urine YELLOW YELLOW   APPearance HAZY (A) CLEAR   Specific Gravity, Urine 1.028 1.005 - 1.030   pH 6.0 5.0 - 8.0   Glucose, UA NEGATIVE NEGATIVE mg/dL   Hgb urine dipstick NEGATIVE NEGATIVE   Bilirubin Urine NEGATIVE NEGATIVE   Ketones, ur NEGATIVE NEGATIVE mg/dL   Protein, ur 30 (A) NEGATIVE mg/dL   Nitrite NEGATIVE NEGATIVE   Leukocytes,Ua NEGATIVE NEGATIVE   RBC / HPF 0-5 0 - 5 RBC/hpf   WBC, UA 0-5 0 - 5 WBC/hpf   Bacteria, UA NONE SEEN NONE SEEN   Squamous Epithelial / LPF 6-10 0 - 5   Mucus PRESENT     Comment: Performed at [facility], [place] [address] [place], [place] [address]  I-Stat beta hCG blood, ED     Status: None   Collection Time: 10/04/22 10:09 PM  Result Value Ref Range   I-stat hCG, quantitative <5.0 <5 mIU/mL   Comment 3  Comment:   GEST. AGE      CONC.  (mIU/mL)   <=1 WEEK        5 - 50     2 WEEKS       50 - 500     3 WEEKS       100 - 10,000     4 WEEKS     1,000 - 30,000        FEMALE AND NON-PREGNANT FEMALE:     LESS THAN 5 mIU/mL     No current facility-administered medications for this encounter.   Current Outpatient Medications  Medication Sig Dispense Refill   doxycycline (VIBRAMYCIN) 100 MG capsule Take 1 capsule (100 mg total) by mouth 2 (two) times daily. 28 capsule 0   ibuprofen (ADVIL,MOTRIN) 800 MG tablet Take 800 mg by mouth every 8 (eight) hours as needed. Patient used this medication for her abdominal pain.      Musculoskeletal: Strength & Muscle Tone: within normal limits Gait & Station: normal Patient leans: N/A  Psychiatric Specialty Exam:  Presentation  General  Appearance:  Disheveled  Eye Contact: Minimal  Speech: Clear and Coherent  Speech Volume: Decreased  Handedness: Right   Mood and Affect  Mood: Dysphoric  Affect: Congruent   Thought Process  Thought Processes: Coherent; Goal Directed  Descriptions of Associations:Intact  Orientation:Full (Time, Place and Person)  Thought Content:Logical  History of Schizophrenia/Schizoaffective disorder:No data recorded Duration of Psychotic Symptoms:No data recorded Hallucinations:Hallucinations: None  Ideas of Reference:None  Suicidal Thoughts:Suicidal Thoughts: Yes, Active  Homicidal Thoughts:Homicidal Thoughts: No   Sensorium  Memory: Immediate Fair; Recent Fair; Remote Fair  Judgment: Intact  Insight: Present   Executive Functions  Concentration: Fair  Attention Span: Fair  Recall: [employer] of Knowledge: Fair  Language: Fair   Psychomotor Activity  Psychomotor Activity: Psychomotor Activity: Normal   Assets  Assets: Communication Skills; Desire for Improvement; Physical Health   Sleep  Sleep: Sleep: Fair   Physical Exam: Physical Exam Cardiovascular:     Rate and Rhythm: Normal rate.  Pulmonary:     Effort: Pulmonary effort is normal.  Musculoskeletal:        General: Normal range of motion.  Neurological:     Mental Status: She is alert and oriented to person, place, and time.    Review of Systems  Constitutional: Negative.   HENT:         C/o left eye pain.   Eyes: Negative.   Respiratory: Negative.    Cardiovascular: Negative.   Gastrointestinal:  Positive for nausea.  Genitourinary: Negative.   Musculoskeletal: Negative.   Neurological: Negative.   Endo/Heme/Allergies: Negative.    Blood pressure 92/61, pulse 81, temperature 98.5 F (36.9 C), temperature source Oral, resp. rate 18, SpO2 98 %, unknown if currently breastfeeding. There is no height or weight on file to calculate BMI.  Treatment Plan  Summary: Patient is currently recommended for continuous assessment and overnight observation at the [facility]. Patient to be considered for Facility Based Crisis unit for substance abuse treatment if no active SI on 10/06/22.    Disposition: Patient is currently recommended for continuous assessment and overnight observation at the [facility] [place] [facility]. Patient to be considered for Facility Based Crisis unit for substance abuse treatment if no active SI on 10/06/22.   Layla Barter, NP 10/05/2022 11:51 AM

## 2022-10-05 NOTE — ED Notes (Signed)
Patient given bag lunch. She is comfortable at this time.

## 2022-10-06 DIAGNOSIS — F1590 Other stimulant use, unspecified, uncomplicated: Secondary | ICD-10-CM | POA: Diagnosis not present

## 2022-10-06 DIAGNOSIS — F1410 Cocaine abuse, uncomplicated: Secondary | ICD-10-CM | POA: Diagnosis not present

## 2022-10-06 DIAGNOSIS — F1994 Other psychoactive substance use, unspecified with psychoactive substance-induced mood disorder: Secondary | ICD-10-CM | POA: Diagnosis not present

## 2022-10-06 DIAGNOSIS — R45851 Suicidal ideations: Secondary | ICD-10-CM | POA: Diagnosis not present

## 2022-10-06 LAB — LIPID PANEL
Cholesterol: 160 mg/dL (ref 0–200)
HDL: 50 mg/dL (ref >40)
LDL Cholesterol: 94 mg/dL (ref 0–99)
Total CHOL/HDL Ratio: 3.2 RATIO
Triglycerides: 78 mg/dL (ref <150)
VLDL: 16 mg/dL (ref 0–40)

## 2022-10-06 LAB — HIV ANTIBODY (ROUTINE TESTING W REFLEX): HIV Screen 4th Generation wRfx: NONREACTIVE

## 2022-10-06 LAB — TSH: TSH: 0.804 u[IU]/mL (ref 0.350–4.500)

## 2022-10-06 MED ORDER — QUETIAPINE FUMARATE 50 MG PO TABS
50.0000 mg | ORAL_TABLET | Freq: Every day | ORAL | Status: DC
  Administered 2022-10-06 – 2022-10-07 (×2): 50 mg via ORAL
  Filled 2022-10-06: qty 14
  Filled 2022-10-06: qty 1
  Filled 2022-10-06: qty 14
  Filled 2022-10-06: qty 1

## 2022-10-06 MED ORDER — ACYCLOVIR 200 MG PO CAPS
400.0000 mg | ORAL_CAPSULE | Freq: Three times a day (TID) | ORAL | Status: DC
  Administered 2022-10-06 – 2022-10-08 (×7): 400 mg via ORAL
  Filled 2022-10-06: qty 2
  Filled 2022-10-06: qty 18
  Filled 2022-10-06 (×3): qty 2
  Filled 2022-10-06: qty 18
  Filled 2022-10-06 (×3): qty 2

## 2022-10-06 MED ORDER — GUAIFENESIN 100 MG/5ML PO LIQD: 10.0000 mL | ORAL | Status: DC | PRN

## 2022-10-06 NOTE — ED Notes (Signed)
Pt sleeping at present, no distress noted.  Monitoring for safety. 

## 2022-10-06 NOTE — ED Notes (Signed)
Patient alert, verbal and able to make needs known. Patient denies SI/HI/AVH today to Probation officer. Patient has been on and off the phone most of the day. Patients left eye continues to be swollen under the eyeball. Provider aware.

## 2022-10-06 NOTE — ED Notes (Addendum)
Pt reports no pain, SI, HI, and AVH.  She does report irritability, prn vistaril will be provided.  Pt's left eye is swollen and has discharge.  Pt reports she still would like to continue her care at the [facility], will transfer once unit is ready for pt. She reports no pain to this area, state "its just annoying."  Breathing is even and unlabored.  Will continue to monitor for safety.

## 2022-10-06 NOTE — ED Provider Notes (Signed)
Asked to see patient by NP, due to continued concern regarding patient eye drainage and rash. On exam patient has 2 large golden, crusted, vesicular lesions under her Left eye as well as smaller, crusted, vesicular lesions on her L lower lip. All lesions resemble  Herpes Simplex virus lesions. Patient reports she has a tingling sensation on the left, lower inside of her lip but has no pain at the eye. Patient sclera did not appear to be injected. Patient infection appears to be along the L trigeminal nerve.  Patient has apparently also been concerned the last 48h that the rash is Herpes, but cannot explain why she thinks the rash is herpes other than "it just looks like it."  Interestingly patient endorses a hx of shingles (HZV) outbreak on her back, but this presentation does not appear to resemble HZV.    On call Opthalmology provider was called, Dr. Hollice Espy reported that he is not concerned that this is an opthalmologic emergency as it does not appear that the virus is in the eye. He reports that usually the rash would present along the upper eye lid, along the V1 distrubution in cases where the virus enters the eye itself. The continued Rayle sclera in this patient was also encouraging that the infection is not within the eye. He has offered for the patient to follow-up in his clinic as early as Tuesday. He recommend that either topical or oral antiviral medication be administered.  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    PGY-3 Damita Dunnings, MD

## 2022-10-06 NOTE — ED Provider Notes (Cosign Needed Addendum)
Behavioral Health Progress Note  Date and Time: 10/06/2022 4:58 PM Name: Donna Zamora MRN:  [id number]  Subjective:  "My eye is getting worse."   Patient seen and evaluated face to face by this provider and chart reviewed. On evaluation, patient is alert and oriented x 4. Her thought process is logical and speech is coherent. Her mood is depressed and affect his congruent. Today, she denies suicidal ideations. She denies HI. She denies AVH. There is no objective evidence that she is currently responding to internal stimuli. She rates her depression today 4/10 with 10 being the worst. She denies anxiety. She reports fair sleep. She reports a fair appetite. She denies alcohol withdrawal symptoms. No ciwa documented by nursing. UDS positive for amphetamines, cocaine and THC, BAL was negative. She continues to voice interest in substance abuse and residential treatment. I discussed with the patient the risk and benefits of restarting Seroquel for mood stabilization. Patient agreeable to restart Seroquel.   I consulted with Dr. Morrie Sheldon to examine the patient's left eye due complaints of pain and increased swelling.   Per Dr. Thera Flake: Asked to see patient by NP, due to continued concern regarding patient eye drainage and rash. On exam patient has 2 large golden, crusted, vesicular lesions under her Left eye as well as smaller, crusted, vesicular lesions on her L lower lip. All lesions resemble  Herpes Simplex virus lesions. Patient reports she has a tingling sensation on the left, lower inside of her lip but has no pain at the eye. Patient sclera did not appear to be injected. Patient infection appears to be along the L trigeminal nerve.   Patient has apparently also been concerned the last 48h that the rash is Herpes, but cannot explain why she thinks the rash is herpes other than "it just looks like it."   Interestingly patient endorses a hx of shingles (HZV) outbreak on her back, but this  presentation does not appear to resemble HZV.      On call Opthalmology provider was called, Dr. Darcel Bayley reported that he is not concerned that this is an opthalmologic emergency as it does not appear that the virus is in the eye. He reports that usually the rash would present along the upper eye lid, along the V1 distrubution in cases where the virus enters the eye itself. The continued Dadisman sclera in this patient was also encouraging that the infection is not within the eye. He has offered for the patient to follow-up in his clinic as early as Tuesday. He recommend that either topical or oral antiviral medication be administered.   Please see media for pictures of the rash   HSV-1 outbreak Recommend trial of acyclovir  TID x 5 days.     Diagnosis:  Final diagnoses:  Suicidal ideation  Substance induced mood disorder (HCC)  Methamphetamine use (HCC)  Cocaine abuse (HCC)  Alcohol abuse    Total Time spent with patient: 30 minutes  Past Psychiatric History: A self-reported psychiatric history of substance abuse, schizophrenia, multiple personalities, depression and PTSD. She reports "a whole lot" of inpatient psychiatric hospitalizations for mental health. Last reported inpatient psychiatric hospitalization was a couple years ago. Past medications includes Seroquel, Depakote, Paxil and Lithium.   Past Medical History:  Past Medical History:  Diagnosis Date   Ectopic pregnancy     Past Surgical History:  Procedure Laterality Date   CESAREAN SECTION     Family History: History reviewed. No pertinent family history. Family Psychiatric  History: No history  reported.  Social History:  Social History   Substance and Sexual Activity  Alcohol Use No     Social History   Substance and Sexual Activity  Drug Use No    Social History   Socioeconomic History   Marital status: Single    Spouse name: Not on file   Number of children: Not on file   Years of education: Not on  file   Highest education level: Not on file  Occupational History   Not on file  Tobacco Use   Smoking status: Every Day    Packs/day: 0.50    Types: Cigarettes   Smokeless tobacco: Never  Substance and Sexual Activity   Alcohol use: No   Drug use: No   Sexual activity: Yes    Birth control/protection: None  Other Topics Concern   Not on file  Social History Narrative   Not on file   Social Determinants of Health   Financial Resource Strain: Not on file  Food Insecurity: Not on file  Transportation Needs: Not on file  Physical Activity: Not on file  Stress: Not on file  Social Connections: Not on file   SDOH:  SDOH Screenings   Tobacco Use: High Risk (10/05/2022)    Current Medications:  Current Facility-Administered Medications  Medication Dose Route Frequency Provider Last Rate Last Admin   acetaminophen (TYLENOL) tablet 650 mg  650 mg Oral Q6H PRN Bisig, Zaydan Papesh L, NP       acyclovir (ZOVIRAX) 200 MG capsule 400 mg  400 mg Oral TID Eliseo GumMcQuilla, Jai B, MD   400 mg at 10/06/22 1657   alum & mag hydroxide-simeth (MAALOX/MYLANTA) 200-200-20 MG/5ML suspension 30 mL  30 mL Oral Q4H PRN Waldvogel, Inari Shin L, NP       chlordiazePOXIDE (LIBRIUM) capsule 25 mg  25 mg Oral Q6H PRN Mccraw, Nasreen Goedecke L, NP       hydrOXYzine (ATARAX) tablet 25 mg  25 mg Oral TID PRN Krantz, Clydie Dillen L, NP   25 mg at 10/05/22 2112   ibuprofen (ADVIL) tablet 600 mg  600 mg Oral Q8H PRN Courtney, Oneda Duffett L, NP   600 mg at 10/05/22 2112   loperamide (IMODIUM) capsule 2-4 mg  2-4 mg Oral PRN Muckle, Shannyn Jankowiak L, NP       magnesium hydroxide (MILK OF MAGNESIA) suspension 30 mL  30 mL Oral Daily PRN Gorder, Kimarie Coor L, NP       multivitamin with minerals tablet 1 tablet  1 tablet Oral Daily Vaux, Maebell Lyvers L, NP   1 tablet at 10/06/22 1020   ondansetron (ZOFRAN-ODT) disintegrating tablet 4 mg  4 mg Oral Q6H PRN Lindo, Brad Lieurance L, NP       QUEtiapine (SEROQUEL) tablet 50 mg  50 mg Oral QHS Beaudin, Chastidy Ranker L, NP       thiamine  (VITAMIN B1) injection 100 mg  100 mg Intramuscular Once Palmatier, Apryle Stowell L, NP       traZODone (DESYREL) tablet 50 mg  50 mg Oral QHS PRN Reiley, Hisashi Amadon L, NP   50 mg at 10/05/22 2112   Current Outpatient Medications  Medication Sig Dispense Refill   doxycycline (VIBRAMYCIN) 100 MG capsule Take 1 capsule (100 mg total) by mouth 2 (two) times daily. (Patient not taking: Reported on 10/05/2022) 28 capsule 0    Labs  Lab Results:  Admission on 10/05/2022  Component Date Value Ref Range Status   TSH 10/06/2022 0.804  0.350 - 4.500 uIU/mL Final   Comment: Performed by  a 3rd Generation assay with a functional sensitivity of <=0.01 uIU/mL. Performed at [facility], [place] [address], [place], [place] [address]    HIV Screen 4th Generation wRfx 10/06/2022 Non Reactive  Non Reactive Final   Performed at [facility], [place] [address], [place], [place] [address]   Cholesterol 10/06/2022 160  0 - 200 mg/dL Final   Triglycerides 10/06/2022 78  <150 mg/dL Final   HDL 10/06/2022 50  >40 mg/dL Final   Total CHOL/HDL Ratio 10/06/2022 3.2  RATIO Final   VLDL 10/06/2022 16  0 - 40 mg/dL Final   LDL Cholesterol 10/06/2022 94  0 - 99 mg/dL Final   Comment:        Total Cholesterol/HDL:CHD Risk Coronary Heart Disease Risk Table                     Men   Women  1/2 Average Risk   3.4   3.3  Average Risk       5.0   4.4  2 X Average Risk   9.6   7.1  3 X Average Risk  23.4   11.0        Use the calculated Patient Ratio above and the CHD Risk Table to determine the patient's CHD Risk.        ATP III CLASSIFICATION (LDL):  <100     mg/dL   Optimal  100-129  mg/dL   Near or Above                    Optimal  130-159  mg/dL   Borderline  160-189  mg/dL   High  >190     mg/dL   Very High Performed at [facility] [address] [place], [place] [address]   Admission on 10/04/2022, Discharged on 10/05/2022  Component Date Value Ref Range Status   SARS Coronavirus 2 by RT PCR  10/04/2022 NEGATIVE  NEGATIVE Final   Comment: (NOTE) SARS-CoV-2 target nucleic acids are NOT DETECTED.  The SARS-CoV-2 RNA is generally detectable in upper respiratory specimens during the acute phase of infection. The lowest concentration of SARS-CoV-2 viral copies this assay can detect is 138 copies/mL. A negative result does not preclude SARS-Cov-2 infection and should not be used as the sole basis for treatment or other patient management decisions. A negative result may occur with  improper specimen collection/handling, submission of specimen other than nasopharyngeal swab, presence of viral mutation(s) within the areas targeted by this assay, and inadequate number of viral copies(<138 copies/mL). A negative result must be combined with clinical observations, patient history, and epidemiological information. The expected result is Negative.  Fact Sheet for Patients:  EntrepreneurPulse.com.au  Fact Sheet for Healthcare Providers:  IncredibleEmployment.be  This test is no                          t yet approved or cleared by the [place] FDA and  has been authorized for detection and/or diagnosis of SARS-CoV-2 by FDA under an Emergency Use Authorization (EUA). This EUA will remain  in effect (meaning this test can be used) for the duration of the COVID-19 declaration under Section 564(b)(1) of the Act, 21 U.S.C.section 360bbb-3(b)(1), unless the authorization is terminated  or revoked sooner.       Influenza A by PCR 10/04/2022 NEGATIVE  NEGATIVE Final   Influenza B by PCR 10/04/2022 NEGATIVE  NEGATIVE  Final   Comment: (NOTE) The Xpert Xpress SARS-CoV-2/FLU/RSV plus assay is intended as an aid in the diagnosis of influenza from Nasopharyngeal swab specimens and should not be used as a sole basis for treatment. Nasal washings and aspirates are unacceptable for Xpert Xpress SARS-CoV-2/FLU/RSV testing.  Fact Sheet for  Patients: BloggerCourse.com  Fact Sheet for Healthcare Providers: SeriousBroker.it  This test is not yet approved or cleared by the [place] FDA and has been authorized for detection and/or diagnosis of SARS-CoV-2 by FDA under an Emergency Use Authorization (EUA). This EUA will remain in effect (meaning this test can be used) for the duration of the COVID-19 declaration under Section 564(b)(1) of the Act, 21 U.S.C. section 360bbb-3(b)(1), unless the authorization is terminated or revoked.  Performed at [facility], [address], [place], [place] [address]    Sodium 10/04/2022 141  135 - 145 mmol/L Final   Potassium 10/04/2022 3.4 (L)  3.5 - 5.1 mmol/L Final   Chloride 10/04/2022 112 (H)  98 - 111 mmol/L Final   CO2 10/04/2022 22  22 - 32 mmol/L Final   Glucose, Bld 10/04/2022 107 (H)  70 - 99 mg/dL Final   Glucose reference range applies only to samples taken after fasting for at least 8 hours.   BUN 10/04/2022 11  6 - 20 mg/dL Final   Creatinine, Ser 10/04/2022 0.69  0.44 - 1.00 mg/dL Final   Calcium 60/45/4098 8.9  8.9 - 10.3 mg/dL Final   Total Protein 11/91/4782 7.4  6.5 - 8.1 g/dL Final   Albumin 95/62/1308 3.4 (L)  3.5 - 5.0 g/dL Final   AST 65/78/4696 33  15 - 41 U/L Final   ALT 10/04/2022 34  0 - 44 U/L Final   Alkaline Phosphatase 10/04/2022 59  38 - 126 U/L Final   Total Bilirubin 10/04/2022 0.5  0.3 - 1.2 mg/dL Final   GFR, Estimated 10/04/2022 >60  >60 mL/min Final   Comment: (NOTE) Calculated using the CKD-EPI Creatinine Equation (2021)    Anion gap 10/04/2022 7  5 - 15 Final   Performed at [facility], [address] [place], [place] [address]   Alcohol, Ethyl (B) 10/04/2022 <10  <10 mg/dL Final   Comment: (NOTE) Lowest detectable limit for serum alcohol is 10 mg/dL.  For medical purposes only. Performed at [facility], [address] [place], [place] [address]    Opiates  10/04/2022 NONE DETECTED  NONE DETECTED Final   Cocaine 10/04/2022 POSITIVE (A)  NONE DETECTED Final   Benzodiazepines 10/04/2022 NONE DETECTED  NONE DETECTED Final   Amphetamines 10/04/2022 POSITIVE (A)  NONE DETECTED Final   Tetrahydrocannabinol 10/04/2022 POSITIVE (A)  NONE DETECTED Final   Barbiturates 10/04/2022 NONE DETECTED  NONE DETECTED Final   Comment: (NOTE) DRUG SCREEN FOR MEDICAL PURPOSES ONLY.  IF CONFIRMATION IS NEEDED FOR ANY PURPOSE, NOTIFY LAB WITHIN 5 DAYS.  LOWEST DETECTABLE LIMITS FOR URINE DRUG SCREEN Drug Class                     Cutoff (ng/mL) Amphetamine and metabolites    1000 Barbiturate and metabolites    200 Benzodiazepine                 200 Opiates and metabolites        300 Cocaine and metabolites        300 THC  50 Performed at [facility], [address], [place], [place] [address]    WBC 10/04/2022 11.5 (H)  4.0 - 10.5 K/uL Final   RBC 10/04/2022 3.91  3.87 - 5.11 MIL/uL Final   Hemoglobin 10/04/2022 8.1 (L)  12.0 - 15.0 g/dL Final   Comment: Reticulocyte Hemoglobin testing may be clinically indicated, consider ordering this additional test [id number]    HCT 10/04/2022 27.6 (L)  36.0 - 46.0 % Final   MCV 10/04/2022 70.6 (L)  80.0 - 100.0 fL Final   MCH 10/04/2022 20.7 (L)  26.0 - 34.0 pg Final   MCHC 10/04/2022 29.3 (L)  30.0 - 36.0 g/dL Final   RDW 36/62/9476 21.2 (H)  11.5 - 15.5 % Final   Platelets 10/04/2022 521 (H)  150 - 400 K/uL Final   nRBC 10/04/2022 0.0  0.0 - 0.2 % Final   Neutrophils Relative % 10/04/2022 72  % Final   Neutro Abs 10/04/2022 8.3 (H)  1.7 - 7.7 K/uL Final   Lymphocytes Relative 10/04/2022 21  % Final   Lymphs Abs 10/04/2022 2.5  0.7 - 4.0 K/uL Final   Monocytes Relative 10/04/2022 5  % Final   Monocytes Absolute 10/04/2022 0.6  0.1 - 1.0 K/uL Final   Eosinophils Relative 10/04/2022 1  % Final   Eosinophils Absolute 10/04/2022 0.1  0.0 - 0.5 K/uL Final   Basophils Relative  10/04/2022 0  % Final   Basophils Absolute 10/04/2022 0.0  0.0 - 0.1 K/uL Final   Immature Granulocytes 10/04/2022 1  % Final   Abs Immature Granulocytes 10/04/2022 0.06  0.00 - 0.07 K/uL Final   Performed at [facility], [address] [place], [place] [address]   I-stat hCG, quantitative 10/04/2022 <5.0  <5 mIU/mL Final   Comment 3 10/04/2022          Final   Comment:   GEST. AGE      CONC.  (mIU/mL)   <=1 WEEK        5 - 50     2 WEEKS       50 - 500     3 WEEKS       100 - 10,000     4 WEEKS     1,000 - 30,000        FEMALE AND NON-PREGNANT FEMALE:     LESS THAN 5 mIU/mL    Salicylate Lvl 10/04/2022 <7.0 (L)  7.0 - 30.0 mg/dL Final   Performed at [facility], [address], [place], [place] [address]   Acetaminophen (Tylenol), Serum 10/04/2022 <10 (L)  10 - 30 ug/mL Final   Comment: (NOTE) Therapeutic concentrations vary significantly. A range of 10-30 ug/mL  may be an effective concentration for many patients. However, some  are best treated at concentrations outside of this range. Acetaminophen concentrations >150 ug/mL at 4 hours after ingestion  and >50 ug/mL at 12 hours after ingestion are often associated with  toxic reactions.  Performed at [facility], [address] [place], [place] [address]    Color, Urine 10/04/2022 YELLOW  YELLOW Final   APPearance 10/04/2022 HAZY (A)  CLEAR Final   Specific Gravity, Urine 10/04/2022 1.028  1.005 - 1.030 Final   pH 10/04/2022 6.0  5.0 - 8.0 Final   Glucose, UA 10/04/2022 NEGATIVE  NEGATIVE mg/dL Final   Hgb urine dipstick 10/04/2022 NEGATIVE  NEGATIVE Final   Bilirubin Urine 10/04/2022 NEGATIVE  NEGATIVE Final  Ketones, ur 10/04/2022 NEGATIVE  NEGATIVE mg/dL Final   Protein, ur 25/95/6387 30 (A)  NEGATIVE mg/dL Final   Nitrite 56/43/3295 NEGATIVE  NEGATIVE Final   Leukocytes,Ua 10/04/2022 NEGATIVE  NEGATIVE Final   RBC / HPF 10/04/2022 0-5  0 - 5 RBC/hpf Final   WBC, UA 10/04/2022 0-5  0 - 5 WBC/hpf  Final   Bacteria, UA 10/04/2022 NONE SEEN  NONE SEEN Final   Squamous Epithelial / LPF 10/04/2022 6-10  0 - 5 Final   Mucus 10/04/2022 PRESENT   Final   Performed at [facility], [address] [place], [place] [address]    Blood Alcohol level:  Lab Results  Component Value Date   ETH <10 10/04/2022    Metabolic Disorder Labs: No results found for: "HGBA1C", "MPG" No results found for: "PROLACTIN" Lab Results  Component Value Date   CHOL 160 10/06/2022   TRIG 78 10/06/2022   HDL 50 10/06/2022   CHOLHDL 3.2 10/06/2022   VLDL 16 10/06/2022   LDLCALC 94 10/06/2022    Physical Findings   Flowsheet Row ED from 10/04/2022 in [facility] EMERGENCY DEPARTMENT Most recent reading at 10/04/2022  8:24 PM ED from 10/04/2022 in [facility] EMERGENCY DEPARTMENT Most recent reading at 10/04/2022  5:55 PM  C-SSRS RISK CATEGORY High Risk No Risk        Musculoskeletal  Strength & Muscle Tone: within normal limits Gait & Station: normal Patient leans: N/A  Psychiatric Specialty Exam  Presentation  General Appearance:  Disheveled  Eye Contact: Fair  Speech: Clear and Coherent  Speech Volume: Normal  Handedness: Right   Mood and Affect  Mood: Depressed  Affect: Congruent   Thought Process  Thought Processes: Coherent  Descriptions of Associations:Intact  Orientation:Full (Time, Place and Person)  Thought Content:Logical     Hallucinations:Hallucinations: None  Ideas of Reference:None  Suicidal Thoughts:Suicidal Thoughts: No  Homicidal Thoughts:Homicidal Thoughts: No   Sensorium  Memory: Immediate Fair; Recent Fair; Remote Fair  Judgment: Fair  Insight: Fair   Chartered certified accountant: Fair  Attention Span: Fair  Recall: [employer] of Knowledge: Fair  Language: Fair   Psychomotor Activity  Psychomotor Activity: Psychomotor Activity: Normal   Assets   Assets: Communication Skills; Desire for Improvement   Sleep  Sleep: Sleep: Fair  Physical Exam  Physical Exam HENT:     Head: Normocephalic.     Nose: Nose normal.     Mouth/Throat:     Comments:  small, crusted, vesicular lesions on her L lower lip.  Eyes:     Comments: 2 large golden, crusted, vesicular lesions under her Left eye   Cardiovascular:     Rate and Rhythm: Normal rate.  Pulmonary:     Effort: Pulmonary effort is normal.  Musculoskeletal:        General: Normal range of motion.     Cervical back: Normal range of motion.  Neurological:     Mental Status: She is alert and oriented to person, place, and time.    Review of Systems  Constitutional: Negative.   HENT: Negative.    Eyes: Negative.   Respiratory: Negative.    Cardiovascular: Negative.   Gastrointestinal: Negative.   Genitourinary: Negative.   Musculoskeletal: Negative.   Skin: Negative.   Neurological: Negative.   Endo/Heme/Allergies: Negative.   Psychiatric/Behavioral:  Positive for depression and substance abuse.    Blood pressure 104/63, pulse (!) 57, temperature 97.8 F (36.6 C), temperature source  Oral, resp. rate 16, SpO2 100 %, unknown if currently breastfeeding. There is no height or weight on file to calculate BMI.  Treatment Plan Summary: Patient recommended for Facility Based Crisis for mood stabilization and substance abuse treatment. Patient is awaiting bed availability for [facility]. Patient is voluntary.   Mood disorder  Start Seroquel 50 mg po QHS  Continue Trazodone 50 mg po QHS prn for sleep Continue Vistaril 25 mg po TID for anxiety  Substance abuse disorder  Continue CIWA Continue librium 25 mg po every 6 hours prn for ciwa greater than 10 Continue thiamine 1 Tablet daily for nutritional supplementation  Continue multivitamin 1 Tablet daily for nutritional supplementation   HSV-1 outbreak Recommend trial of acyclovir 400mg  TID x 5 days.  Ibuprofen 600 mg po every 6  hours for moderate pain or inflammation   Labs Repeat EKG QTC shows 452.   Erial, Fikes, NP 10/06/2022 4:58 PM

## 2022-10-06 NOTE — ED Notes (Signed)
Patient resting in bed with no acute distress.

## 2022-10-07 DIAGNOSIS — R45851 Suicidal ideations: Secondary | ICD-10-CM | POA: Diagnosis not present

## 2022-10-07 DIAGNOSIS — F1590 Other stimulant use, unspecified, uncomplicated: Secondary | ICD-10-CM | POA: Diagnosis not present

## 2022-10-07 DIAGNOSIS — F1410 Cocaine abuse, uncomplicated: Secondary | ICD-10-CM | POA: Diagnosis not present

## 2022-10-07 DIAGNOSIS — F1994 Other psychoactive substance use, unspecified with psychoactive substance-induced mood disorder: Secondary | ICD-10-CM | POA: Diagnosis not present

## 2022-10-07 LAB — RPR
RPR Ser Ql: REACTIVE — AB
RPR Titer: 1:2 {titer}

## 2022-10-07 NOTE — ED Notes (Signed)
Pt sleeping in no acute distress. RR even and unlabored. Environment secured. Will continue to monitor for safety. 

## 2022-10-07 NOTE — ED Notes (Signed)
Pt had bedtime snack. 

## 2022-10-07 NOTE — ED Notes (Signed)
Pt in group actively participating. No acute distress noted. Denies SI/HI/AVH. Denies withdrawal sx from polysubstances. Will continue to monitor for safety.

## 2022-10-07 NOTE — ED Notes (Addendum)
Pt talking on phone by nurse's station. Pt is overheard talking to a person named "Shanon Brow" saying that she is "going through withdrawals and needs to get high one more time." Pt states she is "sexually frustrated" and "doesn't want to rub one out." Pt states she "met a guy here from [place]." Pt continues to state how she wants to leave facility so that she can get high and begins yelling/cussing at the person on the phone for "not listening to her." Pt is reminded about appropriate conversations to have while at [facility] and pt verbalized understanding. Pt is A&O x4, denies current SI/HI/AVH. No signs of distress noted. Monitoring for safety.

## 2022-10-07 NOTE — ED Notes (Addendum)
Pt on telephone. Writer overheard pt on phone talking to someone named Radio producer. Pt stated, "I won't do no ice again but I love my cocaine. I love it. Y'all have to pay Richardson Landry so I can get out of here. I just won't deal with him anymore but I'm going to get my crack (laughing). I don't like it here. I dream about crack, think about it all the time. I need to get out of here. Cane, I want to get out of here tonight. You can take me to [facility] tomorrow if you want. I gave you what you wanted. I only want 1/2 of it back so I can feel better". RN informed pt that phone call would have to end because it's not productive to her treatment. Pt informed Kasandra Knudsen that she was getting off because he wasn't trying to help her and she was getting irritated. Pt hung up phone and went into dayroom to eat a snack. Interacting with peers and watching game show on tv with peers. No acute distress noted. Will continue to monitor for safety.

## 2022-10-07 NOTE — ED Provider Notes (Signed)
Behavioral Health Progress Note  Date and Time: 10/07/2022 11:41 AM Name: Donna Zamora MRN:  [id number]  Subjective:   Donna Zamora is a 36 year old female patient who initially presented to the [facility] emergency department with complaints of suicidal ideations with a plan to overdose and requested inpatient substance abuse treatment.  She reports that she is doing just okay today.  She reports no withdrawal symptoms.  She reports no cravings.  She reports she slept good last night.  She reports her appetite is doing good.  She reports that the swelling in her left eye has been increased since yesterday.  She reports she is not having any pain or changes in vision in that eye.  Discussed with her that it could take a few days for the acyclovir to take effect but that she should notify staff immediately if she notices any change in vision or significant eye pain and she reported understanding.  She reports she is still interested in residential treatment.  Discussed with her that social work would be able to assist her in this.  She reports no SI, HI, or AVH.  She reports no other concerns.  Diagnosis:  Final diagnoses:  Suicidal ideation  Substance induced mood disorder (HCC)  Methamphetamine use (HCC)  Cocaine abuse (HCC)  Alcohol abuse    Total Time spent with patient: 20 minutes  Past Psychiatric History: A self-reported psychiatric history of substance abuse, schizophrenia, multiple personalities, depression and PTSD. She reports "a whole lot" of inpatient psychiatric hospitalizations for mental health. Last reported inpatient psychiatric hospitalization was a couple years ago.    Past Medical History:  Past Medical History:  Diagnosis Date   Ectopic pregnancy     Past Surgical History:  Procedure Laterality Date   CESAREAN SECTION     Family History: History reviewed. No pertinent family history. Family Psychiatric  History: None Reported Social History:  Social History    Substance and Sexual Activity  Alcohol Use No     Social History   Substance and Sexual Activity  Drug Use No    Social History   Socioeconomic History   Marital status: Single    Spouse name: Not on file   Number of children: Not on file   Years of education: Not on file   Highest education level: Not on file  Occupational History   Not on file  Tobacco Use   Smoking status: Every Day    Packs/day: 0.50    Types: Cigarettes   Smokeless tobacco: Never  Substance and Sexual Activity   Alcohol use: No   Drug use: No   Sexual activity: Yes    Birth control/protection: None  Other Topics Concern   Not on file  Social History Narrative   Not on file   Social Determinants of Health   Financial Resource Strain: Not on file  Food Insecurity: Not on file  Transportation Needs: Not on file  Physical Activity: Not on file  Stress: Not on file  Social Connections: Not on file   SDOH:  SDOH Screenings   Tobacco Use: High Risk (10/05/2022)   Additional Social History:                         Sleep: Good  Appetite:  Good  Current Medications:  Current Facility-Administered Medications  Medication Dose Route Frequency Provider Last Rate Last Admin   acetaminophen (TYLENOL) tablet 650 mg  650 mg Oral Q6H PRN Sweeny, Patrice  L, NP       acyclovir (ZOVIRAX) 200 MG capsule 400 mg  400 mg Oral TID Eliseo Gum B, MD   400 mg at 10/07/22 0921   alum & mag hydroxide-simeth (MAALOX/MYLANTA) 200-200-20 MG/5ML suspension 30 mL  30 mL Oral Q4H PRN Errickson, Patrice L, NP       chlordiazePOXIDE (LIBRIUM) capsule 25 mg  25 mg Oral Q6H PRN Kingsbury, Patrice L, NP       guaiFENesin (ROBITUSSIN) 100 MG/5ML liquid 10 mL  10 mL Oral Q4H PRN Bobbitt, Shalon E, NP       hydrOXYzine (ATARAX) tablet 25 mg  25 mg Oral TID PRN Karch, Patrice L, NP   25 mg at 10/06/22 2058   ibuprofen (ADVIL) tablet 600 mg  600 mg Oral Q8H PRN Witherington, Patrice L, NP   600 mg at 10/05/22 2112   loperamide  (IMODIUM) capsule 2-4 mg  2-4 mg Oral PRN Nordahl, Patrice L, NP       magnesium hydroxide (MILK OF MAGNESIA) suspension 30 mL  30 mL Oral Daily PRN Swaney, Patrice L, NP       multivitamin with minerals tablet 1 tablet  1 tablet Oral Daily Blando, Patrice L, NP   1 tablet at 10/07/22 0921   ondansetron (ZOFRAN-ODT) disintegrating tablet 4 mg  4 mg Oral Q6H PRN Slates, Patrice L, NP       QUEtiapine (SEROQUEL) tablet 50 mg  50 mg Oral QHS Lyon, Patrice L, NP   50 mg at 10/06/22 2101   thiamine (VITAMIN B1) injection 100 mg  100 mg Intramuscular Once Masso, Patrice L, NP       traZODone (DESYREL) tablet 50 mg  50 mg Oral QHS PRN Delatte, Patrice L, NP   50 mg at 10/06/22 2234   Current Outpatient Medications  Medication Sig Dispense Refill   doxycycline (VIBRAMYCIN) 100 MG capsule Take 1 capsule (100 mg total) by mouth 2 (two) times daily. (Patient not taking: Reported on 10/05/2022) 28 capsule 0    Labs  Lab Results:  Admission on 10/05/2022  Component Date Value Ref Range Status   TSH 10/06/2022 0.804  0.350 - 4.500 uIU/mL Final   Comment: Performed by a 3rd Generation assay with a functional sensitivity of <=0.01 uIU/mL. Performed at [facility], [address] [place], [place] [address]    RPR Ser Ql 10/06/2022 Reactive (A)  NON REACTIVE Final   SENT FOR CONFIRMATION   RPR Titer 10/06/2022 1:2   Final   Performed at [facility], [address], [place], [place] [address]   HIV Screen 4th Generation wRfx 10/06/2022 Non Reactive  Non Reactive Final   Performed at [facility], [address] [place], [place] [address]   Cholesterol 10/06/2022 160  0 - 200 mg/dL Final   Triglycerides 16/06/3709 78  <150 mg/dL Final   HDL 62/69/4854 50  >40 mg/dL Final   Total CHOL/HDL Ratio 10/06/2022 3.2  RATIO Final   VLDL 10/06/2022 16  0 - 40 mg/dL Final   LDL Cholesterol 10/06/2022 94  0 - 99 mg/dL Final   Comment:        Total Cholesterol/HDL:CHD Risk Coronary Heart Disease  Risk Table                     Men   Women  1/2 Average Risk   3.4   3.3  Average Risk       5.0  4.4  2 X Average Risk   9.6   7.1  3 X Average Risk  23.4   11.0        Use the calculated Patient Ratio above and the CHD Risk Table to determine the patient's CHD Risk.        ATP III CLASSIFICATION (LDL):  <100     mg/dL   Optimal  100-129  mg/dL   Near or Above                    Optimal  130-159  mg/dL   Borderline  160-189  mg/dL   High  >190     mg/dL   Very High Performed at [facility] [address] [place], [place] [address]   Admission on 10/04/2022, Discharged on 10/05/2022  Component Date Value Ref Range Status   SARS Coronavirus 2 by RT PCR 10/04/2022 NEGATIVE  NEGATIVE Final   Comment: (NOTE) SARS-CoV-2 target nucleic acids are NOT DETECTED.  The SARS-CoV-2 RNA is generally detectable in upper respiratory specimens during the acute phase of infection. The lowest concentration of SARS-CoV-2 viral copies this assay can detect is 138 copies/mL. A negative result does not preclude SARS-Cov-2 infection and should not be used as the sole basis for treatment or other patient management decisions. A negative result may occur with  improper specimen collection/handling, submission of specimen other than nasopharyngeal swab, presence of viral mutation(s) within the areas targeted by this assay, and inadequate number of viral copies(<138 copies/mL). A negative result must be combined with clinical observations, patient history, and epidemiological information. The expected result is Negative.  Fact Sheet for Patients:  EntrepreneurPulse.com.au  Fact Sheet for Healthcare Providers:  IncredibleEmployment.be  This test is no                          t yet approved or cleared by the [place] FDA and  has been authorized for detection and/or diagnosis of SARS-CoV-2 by FDA under an Emergency Use Authorization (EUA). This EUA  will remain  in effect (meaning this test can be used) for the duration of the COVID-19 declaration under Section 564(b)(1) of the Act, 21 U.S.C.section 360bbb-3(b)(1), unless the authorization is terminated  or revoked sooner.       Influenza A by PCR 10/04/2022 NEGATIVE  NEGATIVE Final   Influenza B by PCR 10/04/2022 NEGATIVE  NEGATIVE Final   Comment: (NOTE) The Xpert Xpress SARS-CoV-2/FLU/RSV plus assay is intended as an aid in the diagnosis of influenza from Nasopharyngeal swab specimens and should not be used as a sole basis for treatment. Nasal washings and aspirates are unacceptable for Xpert Xpress SARS-CoV-2/FLU/RSV testing.  Fact Sheet for Patients: EntrepreneurPulse.com.au  Fact Sheet for Healthcare Providers: IncredibleEmployment.be  This test is not yet approved or cleared by the [place] FDA and has been authorized for detection and/or diagnosis of SARS-CoV-2 by FDA under an Emergency Use Authorization (EUA). This EUA will remain in effect (meaning this test can be used) for the duration of the COVID-19 declaration under Section 564(b)(1) of the Act, 21 U.S.C. section 360bbb-3(b)(1), unless the authorization is terminated or revoked.  Performed at [facility], [place] [address], [place], [place] [address]    Sodium 10/04/2022 141  135 - 145 mmol/L Final   Potassium 10/04/2022 3.4 (L)  3.5 - 5.1 mmol/L Final   Chloride 10/04/2022 112 (H)  98 - 111 mmol/L Final  CO2 10/04/2022 22  22 - 32 mmol/L Final   Glucose, Bld 10/04/2022 107 (H)  70 - 99 mg/dL Final   Glucose reference range applies only to samples taken after fasting for at least 8 hours.   BUN 10/04/2022 11  6 - 20 mg/dL Final   Creatinine, Ser 10/04/2022 0.69  0.44 - 1.00 mg/dL Final   Calcium 16/09/9603 8.9  8.9 - 10.3 mg/dL Final   Total Protein 54/08/8118 7.4  6.5 - 8.1 g/dL Final   Albumin 14/78/2956 3.4 (L)  3.5 - 5.0 g/dL Final   AST 21/30/8657 33   15 - 41 U/L Final   ALT 10/04/2022 34  0 - 44 U/L Final   Alkaline Phosphatase 10/04/2022 59  38 - 126 U/L Final   Total Bilirubin 10/04/2022 0.5  0.3 - 1.2 mg/dL Final   GFR, Estimated 10/04/2022 >60  >60 mL/min Final   Comment: (NOTE) Calculated using the CKD-EPI Creatinine Equation (2021)    Anion gap 10/04/2022 7  5 - 15 Final   Performed at [facility], [address], [place], [place] [address]   Alcohol, Ethyl (B) 10/04/2022 <10  <10 mg/dL Final   Comment: (NOTE) Lowest detectable limit for serum alcohol is 10 mg/dL.  For medical purposes only. Performed at [facility], [address] [place], [place] [address]    Opiates 10/04/2022 NONE DETECTED  NONE DETECTED Final   Cocaine 10/04/2022 POSITIVE (A)  NONE DETECTED Final   Benzodiazepines 10/04/2022 NONE DETECTED  NONE DETECTED Final   Amphetamines 10/04/2022 POSITIVE (A)  NONE DETECTED Final   Tetrahydrocannabinol 10/04/2022 POSITIVE (A)  NONE DETECTED Final   Barbiturates 10/04/2022 NONE DETECTED  NONE DETECTED Final   Comment: (NOTE) DRUG SCREEN FOR MEDICAL PURPOSES ONLY.  IF CONFIRMATION IS NEEDED FOR ANY PURPOSE, NOTIFY LAB WITHIN 5 DAYS.  LOWEST DETECTABLE LIMITS FOR URINE DRUG SCREEN Drug Class                     Cutoff (ng/mL) Amphetamine and metabolites    1000 Barbiturate and metabolites    200 Benzodiazepine                 200 Opiates and metabolites        300 Cocaine and metabolites        300 THC                            50 Performed at [facility], [address], [place], [place] [address]    WBC 10/04/2022 11.5 (H)  4.0 - 10.5 K/uL Final   RBC 10/04/2022 3.91  3.87 - 5.11 MIL/uL Final   Hemoglobin 10/04/2022 8.1 (L)  12.0 - 15.0 g/dL Final   Comment: Reticulocyte Hemoglobin testing may be clinically indicated, consider ordering this additional test [id number]    HCT 10/04/2022 27.6 (L)  36.0 - 46.0 % Final   MCV 10/04/2022 70.6 (L)  80.0 - 100.0 fL Final   MCH  10/04/2022 20.7 (L)  26.0 - 34.0 pg Final   MCHC 10/04/2022 29.3 (L)  30.0 - 36.0 g/dL Final   RDW 36/64/4034 21.2 (H)  11.5 - 15.5 % Final   Platelets 10/04/2022 521 (H)  150 - 400 K/uL Final   nRBC 10/04/2022 0.0  0.0 - 0.2 % Final   Neutrophils Relative % 10/04/2022 72  % Final   Neutro Abs 10/04/2022 8.3 (H)  1.7 - 7.7 K/uL Final   Lymphocytes Relative 10/04/2022 21  % Final   Lymphs Abs 10/04/2022 2.5  0.7 - 4.0 K/uL Final   Monocytes Relative 10/04/2022 5  % Final   Monocytes Absolute 10/04/2022 0.6  0.1 - 1.0 K/uL Final   Eosinophils Relative 10/04/2022 1  % Final   Eosinophils Absolute 10/04/2022 0.1  0.0 - 0.5 K/uL Final   Basophils Relative 10/04/2022 0  % Final   Basophils Absolute 10/04/2022 0.0  0.0 - 0.1 K/uL Final   Immature Granulocytes 10/04/2022 1  % Final   Abs Immature Granulocytes 10/04/2022 0.06  0.00 - 0.07 K/uL Final   Performed at [facility], [address] [place], [place] [address]   I-stat hCG, quantitative 10/04/2022 <5.0  <5 mIU/mL Final   Comment 3 10/04/2022          Final   Comment:   GEST. AGE      CONC.  (mIU/mL)   <=1 WEEK        5 - 50     2 WEEKS       50 - 500     3 WEEKS       100 - 10,000     4 WEEKS     1,000 - 30,000        FEMALE AND NON-PREGNANT FEMALE:     LESS THAN 5 mIU/mL    Salicylate Lvl 10/04/2022 <7.0 (L)  7.0 - 30.0 mg/dL Final   Performed at [facility], [address] [place], [place] [address]   Acetaminophen (Tylenol), Serum 10/04/2022 <10 (L)  10 - 30 ug/mL Final   Comment: (NOTE) Therapeutic concentrations vary significantly. A range of 10-30 ug/mL  may be an effective concentration for many patients. However, some  are best treated at concentrations outside of this range. Acetaminophen concentrations >150 ug/mL at 4 hours after ingestion  and >50 ug/mL at 12 hours after ingestion are often associated with  toxic reactions.  Performed at [facility], [address] [place], [place] [address]     Color, Urine 10/04/2022 YELLOW  YELLOW Final   APPearance 10/04/2022 HAZY (A)  CLEAR Final   Specific Gravity, Urine 10/04/2022 1.028  1.005 - 1.030 Final   pH 10/04/2022 6.0  5.0 - 8.0 Final   Glucose, UA 10/04/2022 NEGATIVE  NEGATIVE mg/dL Final   Hgb urine dipstick 10/04/2022 NEGATIVE  NEGATIVE Final   Bilirubin Urine 10/04/2022 NEGATIVE  NEGATIVE Final   Ketones, ur 10/04/2022 NEGATIVE  NEGATIVE mg/dL Final   Protein, ur 91/47/829510/19/2023 30 (A)  NEGATIVE mg/dL Final   Nitrite 62/13/086510/19/2023 NEGATIVE  NEGATIVE Final   Leukocytes,Ua 10/04/2022 NEGATIVE  NEGATIVE Final   RBC / HPF 10/04/2022 0-5  0 - 5 RBC/hpf Final   WBC, UA 10/04/2022 0-5  0 - 5 WBC/hpf Final   Bacteria, UA 10/04/2022 NONE SEEN  NONE SEEN Final   Squamous Epithelial / LPF 10/04/2022 6-10  0 - 5 Final   Mucus 10/04/2022 PRESENT   Final   Performed at [facility], [address] [place], [place] [address]    Blood Alcohol level:  Lab Results  Component Value Date   ETH <10 10/04/2022    Metabolic Disorder Labs: No results found for: "HGBA1C", "MPG" No results found for: "PROLACTIN" Lab Results  Component Value Date   CHOL 160 10/06/2022   TRIG 78 10/06/2022   HDL 50 10/06/2022   CHOLHDL 3.2 10/06/2022  VLDL 16 10/06/2022   LDLCALC 94 10/06/2022    Therapeutic Lab Levels: No results found for: "LITHIUM" No results found for: "VALPROATE" No results found for: "CBMZ"  Physical Findings   Flowsheet Row ED from 10/04/2022 in [facility] EMERGENCY DEPARTMENT Most recent reading at 10/04/2022  8:24 PM ED from 10/04/2022 in [facility] EMERGENCY DEPARTMENT Most recent reading at 10/04/2022  5:55 PM  C-SSRS RISK CATEGORY High Risk No Risk        Musculoskeletal  Strength & Muscle Tone: within normal limits Gait & Station: normal Patient leans: N/A  Psychiatric Specialty Exam  Presentation  General Appearance:  Disheveled  Eye Contact: Good  Speech: Clear  and Coherent; Normal Rate  Speech Volume: Normal  Handedness: Right   Mood and Affect  Mood: Dysphoric  Affect: Congruent   Thought Process  Thought Processes: Coherent; Goal Directed  Descriptions of Associations:Intact  Orientation:Full (Time, Place and Person)  Thought Content:Logical; WDL     Hallucinations:Hallucinations: None  Ideas of Reference:None  Suicidal Thoughts:Suicidal Thoughts: No  Homicidal Thoughts:Homicidal Thoughts: No   Sensorium  Memory: Immediate Fair; Recent Fair  Judgment: Fair  Insight: Fair   Art therapist  Concentration: Good  Attention Span: Good  Recall: Good  Fund of Knowledge: Good  Language: Good   Psychomotor Activity  Psychomotor Activity: Psychomotor Activity: Normal   Assets  Assets: Communication Skills; Desire for Improvement; Resilience   Sleep  Sleep: Sleep: Good   No data recorded  Physical Exam  Physical Exam Vitals and nursing note reviewed.  Constitutional:      General: She is not in acute distress.    Appearance: Normal appearance. She is normal weight. She is not ill-appearing or toxic-appearing.  HENT:     Head: Normocephalic and atraumatic.  Eyes:     Comments: Left eye swelling and crusting  Pulmonary:     Effort: Pulmonary effort is normal.  Musculoskeletal:        General: Normal range of motion.  Neurological:     General: No focal deficit present.     Mental Status: She is alert.    Review of Systems  Respiratory:  Negative for cough and shortness of breath.   Cardiovascular:  Negative for chest pain.  Gastrointestinal:  Negative for abdominal pain, constipation, diarrhea, nausea and vomiting.  Neurological:  Negative for dizziness, weakness and headaches.  Psychiatric/Behavioral:  Negative for depression, hallucinations and suicidal ideas. The patient is not nervous/anxious.    Blood pressure 107/76, pulse 68, temperature 98.6 F (37 C), temperature  source Oral, resp. rate 17, SpO2 98 %, unknown if currently breastfeeding. There is no height or weight on file to calculate BMI.  Treatment Plan Summary: Daily contact with patient to assess and evaluate symptoms and progress in treatment and Medication management  Donna Zamora is a 36 year old female patient who initially presented to the [facility] emergency department with complaints of suicidal ideations with a plan to overdose and requested inpatient substance abuse treatment.   Reham is tolerating withdrawal well.  She has tolerated starting the acyclovir without any issues.  She reports there is more swelling today than yesterday but reports no pain or change in vision.  She has tolerated starting the Seroquel without any issues.  She is not reporting any psychotic symptoms at this time.  We will not make any changes to medications at this time.  She is interested in Residential Treatment and so Social Work will assist with this.  We will continue to monitor.   Mood Disorder:  -Continue Seroquel 50 mg po QHS     Withdrawal: -Continue CIWA, last score= 0  @ 0706  10/22 -Continue Librium 25 mg q6 PRN CIWA>10 -Continue Imodium 2-4 mg PRN diarrhea -Continue Zofran-ODT 4 mg q6 PRN nausea -Continue Multivitamin daily for nutritional supplementation     HSV-1 outbreak -Continue Acyclovir 400mg  TID for 5 days. (Day 2 of 5) Ibuprofen 600 mg po every 6 hours for moderate pain or inflammation    -Continue Guaifenesin (100 mg/5 ml) 10 ml q4 PRN -Continue PRN's: Tylenol, Maalox, Atarax, Milk of Magnesia, Trazodone   , MD 10/07/2022 11:41 AM

## 2022-10-07 NOTE — ED Notes (Signed)
Attended group, watch a video

## 2022-10-07 NOTE — ED Notes (Signed)
Patient admitted to [facility] endorsing  ETOH abuse. Patient was cooperative during the admission assessment. Skin assessment complete. Belongings inventoried. Patient oriented to unit and unit rules.Snack and drinks offered to patient. Patient verbalized agreement to treatment plans. Patient verbally contracts for safety during hospitalization. Will continue to monitor for safety.

## 2022-10-08 DIAGNOSIS — R45851 Suicidal ideations: Secondary | ICD-10-CM | POA: Diagnosis not present

## 2022-10-08 DIAGNOSIS — F1994 Other psychoactive substance use, unspecified with psychoactive substance-induced mood disorder: Secondary | ICD-10-CM | POA: Diagnosis not present

## 2022-10-08 DIAGNOSIS — F1590 Other stimulant use, unspecified, uncomplicated: Secondary | ICD-10-CM | POA: Diagnosis not present

## 2022-10-08 DIAGNOSIS — F1410 Cocaine abuse, uncomplicated: Secondary | ICD-10-CM | POA: Diagnosis not present

## 2022-10-08 LAB — GC/CHLAMYDIA PROBE AMP (~~LOC~~) NOT AT ARMC
Chlamydia: NEGATIVE
Comment: NEGATIVE
Comment: NORMAL
Neisseria Gonorrhea: NEGATIVE

## 2022-10-08 LAB — T.PALLIDUM AB, TOTAL: T Pallidum Abs: REACTIVE — AB

## 2022-10-08 MED ORDER — QUETIAPINE FUMARATE 50 MG PO TABS: 50.0000 mg | ORAL_TABLET | Freq: Every day | ORAL | 0 refills | Status: DC

## 2022-10-08 MED ORDER — ACYCLOVIR 200 MG PO CAPS: 400.0000 mg | ORAL_CAPSULE | Freq: Three times a day (TID) | ORAL | 0 refills | Status: DC

## 2022-10-08 NOTE — Discharge Instructions (Addendum)
[facility] [address] [place], [place], [address] [phone] phone [phone] fax NOTE: Does have Substance Abuse-Intensive Outpatient Program [facility]) as well as transitional housing if eligible.  [place], [place] [address] [phone]  [facility]: Substance Use Transitional Living [phone] II [address]arna [address], [place] [address] [phone]   [place] [address] [place], [place]  [address] [phone] Population served: Female veterans 18+ with substance abuse issues Eligibility: By referral only  [facility] [address] [place], [place] [address] [phone] Population served: Adult men & women (11 years old and older, able to perform activities for daily living) Documents required: Valid ID & [place] [address] [place], [place]  [address] [phone] Population served: Families with children  [place] [address], [place], [place]  [address] [phone] Population served: Single women 18+ without dependents Documents required:  Cottle Card  [facility] [address] [place], [place]  [address] [phone] Population served: Female veterans 18+ with substance abuse/mental health issues Eligibility: By referral only  [facility] [address], [place], [place] [address] [phone] Population served: Males 18+ Documents required: Valid ID & Social Security Card  [facility] at [employer] [facility] [place] [address] [place] [address] [phone] or [phone] Population served: Pregnant women with or without children Documents required: Valid ID & Social Security [employer] [place], [place], [place] [phone] Population Served: Families with children, adult women, and adult men.  [facility] [address], [place], [place] [address] [phone] Population served: Men 18+, preference for disabled and/or veterans Eligibility: By referral only  [facility]) - Emergency Family [facility] [place], [place], [place] [address] [phone] or [phone] Population served: Families with children.

## 2022-10-08 NOTE — ED Notes (Signed)
Snacks given 

## 2022-10-08 NOTE — ED Provider Notes (Signed)
FBC/OBS ASAP Discharge Summary  Date and Time: 10/08/2022 6:08 PM  Name: Donna Zamora  MRN:  [id number]   Discharge Diagnoses:  Final diagnoses:  Suicidal ideation  Substance induced mood disorder ([place])  Methamphetamine use ([place])  Cocaine abuse ([place])  Alcohol abuse    Subjective: Donna Zamora 36 y.o., female patient who initially presented to the [facility] emergency department with complaints of SI with a plan to overdose and at that time she requested inpatient substance abuse treatment. Donna Zamora, 36 y.o., female patient seen face to face by this provider, consulted with Dr. Dwyane Dee; and chart reviewed on 10/08/22.  Per chart review patient reports a past psychiatric history of substance abuse, schizophrenia, multiple personalities, depression, and PTSD.  She also reports many inpatient psychiatric hospitalizations.  On admission UDS positive for amphetamines, cocaine and THC, BAL was negative.  Notified by nursing that patient is requesting discharge.  On evaluation Donna Zamora reports, "I just cannot do it I am not ready I want to go get high".  Patient is requesting to be discharged.  Provided encouragement, support, and reassurance.  Discussed the dangers of continued drug use including death.  Patient verbalized understanding but states, "I do not care I just want to get high".  Extensively discussed with patient staying in the [facility] and being discharged in the a.m. to [facility] residential.  Again she verbalized understanding but continued to request discharge.  Explained to patient that her [facility] residential appointment is still standing and that she can present on her own in the a.m. with all of her discharge paperwork, simple medications, and printed prescriptions.  Also discussed with patient that her RPR results are reactive and in 2-3 days when final lab results are released she can expect a call from Dr. Valarie Merino if she needs treatment.  Patient reports that she was diagnosed with  syphilis in the past.  During the evaluation Donna Zamora is alert/oriented x4, cooperative, and attentive.  Her speech is clear, coherent, at a normal rate and tone.  She is anxious.  She denies depression at this time.  States she has been talking to a friend who is willing to come pick her up.  She is denying SI/HI/AVH.  She is contracting for safety.  She denies any access to firearms/weapons.  She denied for any collateral to be contacted.  There is no evidence of psychosis/mania or delusional thinking.  She denies paranoia.  There is no evidence that she is responding to internal/external stimuli.  Stay Summary: Donna Zamora remained calm/cooperative while on the unit.  She exhibited no unsafe behaviors.  She was appropriate with staff and other patients.  She was compliant with medications.  She will be discharged with current medication and was instructed on how to take medications as prescribed; (details listed below under Medication List).    She was referred to [facility] residential treatment facility and was accepted.  She has an intake appointment on 10/09/2022 at [place].  Patient verbalized understanding that appointment is still standing and she can present in the a.m. on her own.  Discussed with patient that she will be discharged AMA.  She verbalized understanding.  She does not meet criteria for inpatient psychiatric admission, nor does she meet criteria for involuntary commitment.  Patient will be discharged at her request AMA.Marland Kitchen  Upon completion of this admission the Donna Zamora was both mentally and medically stable for discharge denying suicidal/homicidal ideation, auditory/visual/tactile hallucinations, delusional thoughts and paranoia.     Total Time spent  with patient: 30 minutes  Past Psychiatric History: A self-reported psychiatric history of substance abuse, schizophrenia, multiple personalities, depression and PTSD. She reports "a whole lot" of inpatient psychiatric hospitalizations for  mental health. Last reported inpatient psychiatric hospitalization was a couple years ago.  Past Medical History:  Past Medical History:  Diagnosis Date   Ectopic pregnancy     Past Surgical History:  Procedure Laterality Date   CESAREAN SECTION     Family History: History reviewed. No pertinent family history. Family Psychiatric History: None reported Social History:  Social History   Substance and Sexual Activity  Alcohol Use No     Social History   Substance and Sexual Activity  Drug Use No    Social History   Socioeconomic History   Marital status: Single    Spouse name: Not on file   Number of children: Not on file   Years of education: Not on file   Highest education level: Not on file  Occupational History   Not on file  Tobacco Use   Smoking status: Every Day    Packs/day: 0.50    Types: Cigarettes   Smokeless tobacco: Never  Substance and Sexual Activity   Alcohol use: No   Drug use: No   Sexual activity: Yes    Birth control/protection: None  Other Topics Concern   Not on file  Social History Narrative   Not on file   Social Determinants of Health   Financial Resource Strain: Not on file  Food Insecurity: Not on file  Transportation Needs: Not on file  Physical Activity: Not on file  Stress: Not on file  Social Connections: Not on file   SDOH:  SDOH Screenings   Tobacco Use: High Risk (10/05/2022)    Tobacco Cessation:  N/A, patient does not currently use tobacco products  Current Medications:  Current Facility-Administered Medications  Medication Dose Route Frequency Provider Last Rate Last Admin   acetaminophen (TYLENOL) tablet 650 mg  650 mg Oral Q6H PRN Donahoe, Patrice L, NP       acyclovir (ZOVIRAX) 200 MG capsule 400 mg  400 mg Oral TID Damita Dunnings B, MD   400 mg at 10/08/22 1558   alum & mag hydroxide-simeth (MAALOX/MYLANTA) 200-200-20 MG/5ML suspension 30 mL  30 mL Oral Q4H PRN Pearse, Patrice L, NP       guaiFENesin  (ROBITUSSIN) 100 MG/5ML liquid 10 mL  10 mL Oral Q4H PRN Bobbitt, Shalon E, NP       hydrOXYzine (ATARAX) tablet 25 mg  25 mg Oral TID PRN Shannon, Patrice L, NP   25 mg at 10/06/22 2058   ibuprofen (ADVIL) tablet 600 mg  600 mg Oral Q8H PRN Yott, Patrice L, NP   600 mg at 10/05/22 2112   magnesium hydroxide (MILK OF MAGNESIA) suspension 30 mL  30 mL Oral Daily PRN Dils, Patrice L, NP       multivitamin with minerals tablet 1 tablet  1 tablet Oral Daily Wingate, Patrice L, NP   1 tablet at 10/08/22 0921   QUEtiapine (SEROQUEL) tablet 50 mg  50 mg Oral QHS Belcher, Patrice L, NP   50 mg at 10/07/22 2118   thiamine (VITAMIN B1) injection 100 mg  100 mg Intramuscular Once Shaheed, Patrice L, NP       traZODone (DESYREL) tablet 50 mg  50 mg Oral QHS PRN Cottman, Patrice L, NP   50 mg at 10/07/22 2118   Current Outpatient Medications  Medication Sig Dispense  Refill   acyclovir (ZOVIRAX) 200 MG capsule Take 2 capsules (400 mg total) by mouth 3 (three) times daily. 14 capsule 0   QUEtiapine (SEROQUEL) 50 MG tablet Take 1 tablet (50 mg total) by mouth at bedtime. 30 tablet 0    PTA Medications: (Not in a hospital admission)       No data to display          [place] ED from 10/04/2022 in [place] Most recent reading at 10/04/2022  8:24 PM ED from 10/04/2022 in [place] Most recent reading at 10/04/2022  5:55 PM  C-SSRS RISK CATEGORY High Risk No Risk       Musculoskeletal  Strength & Muscle Tone: within normal limits Gait & Station: normal Patient leans: N/A  Psychiatric Specialty Exam  Presentation  General Appearance:  Appropriate for Environment; Casual  Eye Contact: Good  Speech: Clear and Coherent; Normal Rate  Speech Volume: Normal  Handedness: Right   Mood and Affect  Mood: Anxious  Affect: Congruent   Thought Process  Thought Processes: Coherent  Descriptions of  Associations:Intact  Orientation:Full (Time, Place and Person)  Thought Content:Logical     Hallucinations:Hallucinations: None  Ideas of Reference:None  Suicidal Thoughts:Suicidal Thoughts: No  Homicidal Thoughts:Homicidal Thoughts: No   Sensorium  Memory: Immediate Good; Recent Good; Remote Good  Judgment: Good  Insight: Good   Executive Functions  Concentration: Good  Attention Span: Good  Recall: Good  Fund of Knowledge: Good  Language: Good   Psychomotor Activity  Psychomotor Activity: Psychomotor Activity: Normal   Assets  Assets: Social Support; Resilience; Physical Health   Sleep  Sleep: Sleep: Good   No data recorded  Physical Exam  Physical Exam Vitals and nursing note reviewed.  Constitutional:      General: She is not in acute distress.    Appearance: Normal appearance. She is not ill-appearing.  HENT:     Head: Normocephalic.  Eyes:     General:        Right eye: No discharge.        Left eye: No discharge.     Conjunctiva/sclera: Conjunctivae normal.  Cardiovascular:     Rate and Rhythm: Normal rate.  Pulmonary:     Effort: Pulmonary effort is normal.  Musculoskeletal:        General: Normal range of motion.     Cervical back: Normal range of motion.  Skin:    Coloration: Skin is not jaundiced or pale.  Neurological:     Mental Status: She is alert and oriented to person, place, and time.  Psychiatric:        Attention and Perception: Attention and perception normal.        Mood and Affect: Affect normal. Mood is anxious.        Speech: Speech normal.        Behavior: Behavior is cooperative.        Thought Content: Thought content normal.        Cognition and Memory: Cognition normal.        Judgment: Judgment is impulsive.    Review of Systems  Constitutional: Negative.   HENT: Negative.    Eyes: Negative.   Respiratory: Negative.    Cardiovascular: Negative.   Musculoskeletal: Negative.   Skin:  Negative.   Neurological: Negative.   Psychiatric/Behavioral:  The patient is nervous/anxious.    Blood pressure 116/79, pulse 75, temperature 97.7 F (36.5 C), temperature  source Oral, resp. rate 18, SpO2 100 %, unknown if currently breastfeeding. There is no height or weight on file to calculate BMI.  Demographic Factors:  Low socioeconomic status and Unemployed  Loss Factors: Financial problems/change in socioeconomic status  Historical Factors: Impulsivity  Risk Reduction Factors:   Sense of responsibility to family, Positive social support, Positive therapeutic relationship, and Positive coping skills or problem solving skills  Continued Clinical Symptoms:  Severe Anxiety and/or Agitation Depression:   Comorbid alcohol abuse/dependence Impulsivity Alcohol/Substance Abuse/Dependencies  Cognitive Features That Contribute To Risk:  None    Suicide Risk:  Minimal: No identifiable suicidal ideation.  Patients presenting with no risk factors but with morbid ruminations; may be classified as minimal risk based on the severity of the depressive symptoms  Plan Of Care/Follow-up recommendations:  Activity:  as tolerated  Diet:  regular   Disposition:   Discharge patient  Patient has an intake appointment at [facility] residential on 09/17/2022 at 9 AM.  She will be given a 14-day sample of acyclovir 200 mg by mouth 3 times daily, and Seroquel 50 mg nightly.  She is also provided with a printed prescription with a 1 month refill.   No evidence of imminent risk to self or others at present.    Patient does not meet criteria for psychiatric inpatient admission. Discussed crisis plan, support from social network, calling 911, coming to the Emergency Department, and calling Suicide Hotline.    Revonda Humphrey, NP 10/08/2022, 6:08 PM

## 2022-10-08 NOTE — ED Notes (Signed)
Pt sleeping in no acute distress. RR even and unlabored. Environment secured. Will continue to monitor for safety. 

## 2022-10-08 NOTE — ED Notes (Signed)
Pt asleep in bed. Respirations even and unlabored. Monitoring for safety. 

## 2022-10-08 NOTE — ED Notes (Addendum)
Pt discharged with belongings in hand, discharge paperwork, samples and RX's. Pt states, "I'll be back tomorrow guys. Trust me. Support provided. Pt states, "I'm not even gonna lie, I'm getting ready to get high as fast as I can. It's calling my name. It always do (cocaine). Encouragement provided. Pt left facility with bus pass in hand. Safety maintained.

## 2022-10-08 NOTE — ED Notes (Signed)
Patient A&Ox4. Denies intent to harm self/others when asked. Denies A/VH. Patient denies any physical complaints when asked. No acute distress noted. Routine safety checks conducted according to facility protocol. Encouraged patient to notify staff if thoughts of harm toward self or others arise. Patient verbalize understanding and agreement. Will continue to monitor for safety.    

## 2022-10-08 NOTE — ED Notes (Signed)
Pt on phone. No acute distress noted. Informed pt to notify staff with any needs or concerns. Will continue to monitor for safety.

## 2022-10-08 NOTE — ED Notes (Addendum)
Pt approached nurses station requesting to discharge. Pt states, "I'm not ready to quit right now. I tried and always, around the 4th day, I give up. I can try again when I feel like I'm ready. Staff attempted encouragement to continue the fight and pt was given inspirational quotes on paper provided by staff but pt insisted on wanting to leave. Contact made to provider via secure chat and writer spoke with provider in facility about pt request. Provider en route to assess pt. Pt made aware. Pt state, "I just want to get high and I'll check myself in [facility] tomorrow. Writer informed pt that [facility] Pester would not take pt if she uses drugs tonight. She would be tested tomorrow at [facility] and if her results were positive for drugs, she would have to complete a detox program before being admitted to [facility]. Pt states, "I don't care". Pt went into dayroom to say goodbye to peers and stated, "I'll be back tomorrow y'all (laughing).

## 2022-10-08 NOTE — Progress Notes (Signed)
LCSW was provided an update from MD regarding plan. Per MD, patient is interested in residential placement or sober living options at this time. LCSW to follow up with patient and discuss disposition plans further.   LCSW followed up with Michelle Burns at [facility] [phone] to provide patient identifying information for review. Per Michelle, no insurance has been identified for the patient. Referral to be faxed over to facility for review. Clinicals were faxed to [phone]. Per Michelle, she will follow back up once referral has been reviewed.   Willmer Fellers, LCSW Clinical Social Worker [place] [facility] Ph: [phone]  

## 2022-10-08 NOTE — Tx Team (Signed)
LCSW met with patient to assess current mood, affect, physical state, and inquire about needs/goals while here in [facility] and after discharge. Patient reports presenting to [facility] due to SI, hallucinations, and substance use. Patient reports "almost overdosing on meth". Patient reports she is needing help because she has been doing "every type of drug: meth, heroin, crack, PCP, and alcohol". Patient reports she has been "using everything, everyday". Patient reports she was staying with a friend, however she has been told that she is unable to return due to drug use. Patient reports she is interested in residential treatment at this time as she needs to "get it together". Patient identifies her lack of housing and finances as her current stressors. Patient identifies her friend Shanon Brow as her social support. Patient denies receiving any prior outpatient services. Patient aware that LCSW made referral to [facility] and she has been accepted for admission on tomorrow. Patient was appreciative of LCSW assistance. No other needs were reported at this time.   Referral was received and per Sharyn Lull, patient has been accepted and can transfer to the facility on tomorrow by 9:00am. Update has been provided to the patient and MD made aware. Patient will need a 14-30 day supply of medication and one month refill. No nicotine gum allowed, however 14-30 day nicotine patches to be provided if needed.     Lucius Conn, LCSW Clinical Social Worker [place] [facility] Ph: [phone]

## 2022-10-10 NOTE — ED Provider Notes (Signed)
Called patient at the number listed in the chart to discuss positive syphilis testing that just resulted and the need to follow-up with PCP/infectious disease.  Did not receive an answer after 2 calls, left a voice message.  We will try again soon.  Corky Sox, MD PGY-2

## 2023-02-18 ENCOUNTER — Ambulatory Visit: Payer: Self-pay | Admitting: Physician Assistant

## 2023-02-18 ENCOUNTER — Encounter: Payer: Self-pay | Admitting: Physician Assistant

## 2023-02-18 VITALS — BP 96/66 | HR 98 | Ht 66.0 in | Wt 212.0 lb

## 2023-02-18 DIAGNOSIS — Z720 Tobacco use: Secondary | ICD-10-CM | POA: Insufficient documentation

## 2023-02-18 DIAGNOSIS — A539 Syphilis, unspecified: Secondary | ICD-10-CM

## 2023-02-18 DIAGNOSIS — R051 Acute cough: Secondary | ICD-10-CM

## 2023-02-18 DIAGNOSIS — Z87898 Personal history of other specified conditions: Secondary | ICD-10-CM

## 2023-02-18 DIAGNOSIS — F1210 Cannabis abuse, uncomplicated: Secondary | ICD-10-CM

## 2023-02-18 DIAGNOSIS — F1410 Cocaine abuse, uncomplicated: Secondary | ICD-10-CM

## 2023-02-18 DIAGNOSIS — Z8619 Personal history of other infectious and parasitic diseases: Secondary | ICD-10-CM

## 2023-02-18 DIAGNOSIS — R112 Nausea with vomiting, unspecified: Secondary | ICD-10-CM

## 2023-02-18 DIAGNOSIS — F17210 Nicotine dependence, cigarettes, uncomplicated: Secondary | ICD-10-CM

## 2023-02-18 DIAGNOSIS — B182 Chronic viral hepatitis C: Secondary | ICD-10-CM

## 2023-02-18 DIAGNOSIS — F1010 Alcohol abuse, uncomplicated: Secondary | ICD-10-CM

## 2023-02-18 MED ORDER — ONDANSETRON HCL 4 MG PO TABS
8.0000 mg | ORAL_TABLET | Freq: Once | ORAL | Status: AC
  Administered 2023-02-18: 8 mg via ORAL

## 2023-02-18 MED ORDER — BENZONATATE 100 MG PO CAPS: ORAL_CAPSULE | ORAL | 0 refills | Status: DC

## 2023-02-18 MED ORDER — DOXYCYCLINE HYCLATE 100 MG PO CAPS: 100.0000 mg | ORAL_CAPSULE | Freq: Two times a day (BID) | ORAL | 0 refills | Status: DC

## 2023-02-18 NOTE — Patient Instructions (Signed)
To help with your cough, you are going to use Tessalon Perles as directed.  Make sure that you are staying well-hydrated, continue using ibuprofen or Tylenol to help with your body aches.  To treat your syphilis, you are going to take doxycycline twice a day for 28 days.  It is very important that you be retested for this 6 months after your treatment.  We will call you with today's lab results.  Kennieth Rad, PA-C Physician Assistant [facility] [email or web address]  Syphilis Syphilis is a curable infection that can spread through sexual contact. It is important to get treatment right away to reduce the possibility of serious complications. There are four stages of syphilis: Primary stage. During this stage, sores may form where the disease entered your body. Secondary stage. During this stage, skin rashes and lesions will form. Latent stage. During this stage, there are no symptoms, but the infection may still be contagious. Tertiary stage. This stage happens 5-30 years after the infection starts. During this stage, the disease damages organs and can lead to death. Most people with treated syphilis do not develop this stage. What are the causes? This condition is caused by bacteria called Treponema pallidum. The condition can spread during sexual activity, such as during oral, anal, or vaginal sex. It can also be spread to an unborn baby (fetus) during pregnancy. What increases the risk? You are more likely to develop this condition if: You do not use a condom during sex. You have sex with a partner who has syphilis. You have a history of sexually transmitted infections (STIs). You use recreational drugs such as methamphetamines, injection drugs, or heroin. What are the signs or symptoms? Symptoms of this condition depend on the stage of the disease. Primary stage One or more painless sores (chancres) in and around the genital  organs, mouth, or hands. The sores are usually firm and round. Secondary stage Skin rashes or sores (or both) in the mouth, vagina, anus, palms of the hands, or bottoms of the feet. The rash may be rough, red or reddish-brown, and may not itch. Other symptoms may include: A fever. Swollen lymph glands. A sore throat. Patchy hair loss. A headache. A feeling of being ill or very tired. Weight loss. Latent stage There are no symptoms during this stage. Tertiary stage This stage is rarely noted due to the use of antibiotic medicine to treat syphilis. However, without treatment, syphilis can further affect different organ systems, especially the heart and nervous system. Signs and symptoms impacting the heart include: Enlargement of the heart. Damage to the aorta. Narrowing of the blood vessels around the heart. Signs and symptoms impacting the nervous system include: Meningitis. Dysfunction of the nerves to the head. Nonspecific growths may also occur in the skin, mucous membranes, bones, or body organs in this stage. Any stage if there is no treatment Signs and symptoms of impacting the nervous system include: A severe headache. Muscle weakness or difficulty coordinating movements, such as walking. Changes in mental state, such as confusion, personality changes, or dementia. Signs and symptoms of impacting the eyes include: Eye pain. Eye redness. Changes in vision. Blindness. Signs and symptoms of impacting the ears include: Hearing loss. Ringing in the ears (tinnitus). Dizziness or feeling like you or your surroundings are moving or spinning (vertigo). How is this diagnosed? This condition is diagnosed with: A physical exam. Blood tests, including: Nontreponemal test. This is usually the first test. It can detect other kinds of antibodies as  well. Treponemal test. This test is done if you get a positive result on the nontreponemal test. It specifically looks for antibodies to  syphilis. This test will not show whether antibodies are from a past syphilis infection or a current infection. Tests of the fluid (drainage) from a sore or rash. Tests of the fluid around the spine (lumbar puncture). These tests are done to check for an infection in the brain or nervous system in late-stage syphilis. Imaging tests. These may be done to check for damage to the heart, aorta, or brain if the condition is in the tertiary stage. Tests may include: X-ray. CT scan. MRI. Echocardiogram. This test takes a picture of the heart. How is this treated? This condition can be cured with antibiotic medicine. It is important to receive treatment as soon as possible to get rid of the infection. However, the treatment may not undo any damage already caused by the infection. Follow these instructions at home: Medicines  Take over-the-counter and prescription medicines only as told by your health care provider. Take your antibiotic medicine as told by your health care provider. Do not stop taking the antibiotic even if you start to feel better. Incomplete treatment will put you at risk for continued infection and could be life-threatening. General instructions Do not have sex until your treatment is completed, or as directed by your health care provider. Tell your recent sexual partners that you were diagnosed with syphilis. It is important that they get treatment, even if they do not have symptoms. Keep all follow-up visits. This is important. How is this prevented? Use latex or polyurethane condoms correctly whenever you have sex. Before you have sex, ask your partner if they have been tested for STIs. Ask about the test results. Avoid having multiple sexual partners. Contact a health care provider if: You continue to have any of the following symptoms 24 hours after beginning treatment: Fever. Chills. Headache. Nausea. Aching all over your body. Your symptoms do not improve, even with  treatment. Get help right away if: You have severe chest pain. You have trouble walking or coordinating movements. You are confused. You lose vision or hearing. You have numbness in your arms or legs. You have a seizure. You faint. You have a severe headache that does not go away with medicine. These symptoms may be an emergency. Get help right away. Call 911. Do not wait to see if the symptoms will go away. Do not drive yourself to the hospital. Summary Syphilis is an infection that can spread through sexual contact or to a fetus during pregnancy. This condition can cause serious complications, so it is best to get treatment right away. The condition can be cured with antibiotic medicine. Take your antibiotic medicine as told by your health care provider. Tell your recent sexual partners that you were diagnosed with syphilis. It is important that they get treatment, even if they do not have symptoms. This information is not intended to replace advice given to you by your health care provider. Make sure you discuss any questions you have with your health care provider. Document Revised: 10/27/2021 Document Reviewed: 10/27/2021 Elsevier Patient Education  [place].

## 2023-02-18 NOTE — Progress Notes (Signed)
New Patient Office Visit  Subjective    Patient ID: Donna Zamora, female    DOB: 1986-07-25  Age: 37 y.o. MRN: [id number]  CC:  Chief Complaint  Patient presents with   Std screening    Cough    Sneezing    HPI Donna Zamora states that she is currently being treated for substance abuse at [facility] residential treatment center.  States that she arrived 02/06/23; no plan for aftercare yet.  States that she has been experiencing a dry cough for the past 3 days, states that she also has been having bodyaches.  Denies fever, nausea, vomiting.  Had COVID test earlier this morning that was negative.  States that she has been using Robitussin, ibuprofen and hot showers with some relief.  States that she was previously told she has hepatitis C, states that she did not have any treatment.  States that she did have treatment for syphilis prior to the October 2023 lab test showing she was positive for syphilis.  States that she is unsure of the timeline and states that she received "1 shot but did not know what it was" does state that she did not return for further treatment.  Denies any STI symptoms.  States that she is confident she is not pregnant.  States moods are stable, sleep is good.   Outpatient Encounter Medications as of 02/18/2023  Medication Sig   benzonatate (TESSALON) 100 MG capsule Take 1-2 caps PO TID PRN   doxycycline (VIBRAMYCIN) 100 MG capsule Take 1 capsule (100 mg total) by mouth 2 (two) times daily for 28 days.   acyclovir (ZOVIRAX) 200 MG capsule Take 2 capsules (400 mg total) by mouth 3 (three) times daily.   QUEtiapine (SEROQUEL) 50 MG tablet Take 1 tablet (50 mg total) by mouth at bedtime.   [EXPIRED] ondansetron (ZOFRAN) tablet 8 mg    No facility-administered encounter medications on file as of 02/18/2023.    Past Medical History:  Diagnosis Date   Ectopic pregnancy     Past Surgical History:  Procedure Laterality Date   CESAREAN SECTION      History  reviewed. No pertinent family history.  Social History   Socioeconomic History   Marital status: Single    Spouse name: Not on file   Number of children: Not on file   Years of education: Not on file   Highest education level: Not on file  Occupational History   Not on file  Tobacco Use   Smoking status: Every Day    Packs/day: 0.50    Types: Cigarettes   Smokeless tobacco: Never  Substance and Sexual Activity   Alcohol use: No   Drug use: No   Sexual activity: Yes    Birth control/protection: None  Other Topics Concern   Not on file  Social History Narrative   Not on file   Social Determinants of Health   Financial Resource Strain: Not on file  Food Insecurity: Not on file  Transportation Needs: Not on file  Physical Activity: Not on file  Stress: Not on file  Social Connections: Not on file  Intimate Partner Violence: Not on file    Review of Systems  Constitutional:  Negative for chills and fever.  HENT:  Negative for congestion, ear pain and sore throat.   Eyes: Negative.   Respiratory:  Negative for shortness of breath.   Cardiovascular:  Negative for chest pain.  Gastrointestinal:  Negative for nausea and vomiting.  Genitourinary:  Negative for  dysuria, frequency and hematuria.  Musculoskeletal:  Positive for myalgias.  Skin: Negative.   Neurological:  Negative for headaches.  Endo/Heme/Allergies: Negative.   Psychiatric/Behavioral:  Negative for depression. The patient is not nervous/anxious and does not have insomnia.         Objective    BP 96/66 (BP Location: Left Arm, Patient Position: Sitting, Cuff Size: Large)   Pulse 98   Ht '5\' 6"'$  (1.676 m)   Wt 212 lb (96.2 kg)   LMP 01/21/2023 (Exact Date)   SpO2 98%   BMI 34.22 kg/m   Physical Exam Vitals and nursing note reviewed.  Constitutional:      Appearance: Normal appearance.  HENT:     Head: Normocephalic and atraumatic.     Right Ear: External ear normal.     Left Ear: External ear  normal.     Nose: Nose normal.     Mouth/Throat:     Mouth: Mucous membranes are moist.     Pharynx: Oropharynx is clear.  Eyes:     Extraocular Movements: Extraocular movements intact.     Conjunctiva/sclera: Conjunctivae normal.     Pupils: Pupils are equal, round, and reactive to light.  Cardiovascular:     Rate and Rhythm: Normal rate and regular rhythm.     Pulses: Normal pulses.     Heart sounds: Normal heart sounds.  Pulmonary:     Effort: Pulmonary effort is normal.     Breath sounds: Normal breath sounds.  Musculoskeletal:        General: Normal range of motion.     Cervical back: Normal range of motion and neck supple.  Skin:    General: Skin is warm and dry.  Neurological:     General: No focal deficit present.     Mental Status: She is alert and oriented to person, place, and time.  Psychiatric:        Mood and Affect: Mood normal.        Behavior: Behavior normal.        Thought Content: Thought content normal.        Judgment: Judgment normal.        Assessment & Plan:   Problem List Items Addressed This Visit   None Visit Diagnoses     Acute cough    -  Primary   Relevant Medications   benzonatate (TESSALON) 100 MG capsule   Syphilis       Relevant Medications   doxycycline (VIBRAMYCIN) 100 MG capsule   Nausea and vomiting, unspecified vomiting type       Relevant Medications   ondansetron (ZOFRAN) tablet 8 mg (Completed)   History of hepatitis C       Relevant Orders   HCV Ab w Reflex to Quant PCR   Alcohol abuse       Marijuana abuse       Cocaine abuse ([place])       History of crack cocaine use       Tobacco abuse         1. Acute cough Trial Tessalon [place].  Patient education given on supportive care, red flags given for prompt reevaluation. - benzonatate (TESSALON) 100 MG capsule; Take 1-2 caps PO TID PRN  Dispense: 20 capsule; Refill: 0  2. Syphilis Patient unclear of actual previous treatment plan, patient has penicillin allergy.   Trial doxycycline twice daily for 28 days.  Patient education given on timing for test of cure.  Patient education given on  supportive care. - doxycycline (VIBRAMYCIN) 100 MG capsule; Take 1 capsule (100 mg total) by mouth 2 (two) times daily for 28 days.  Dispense: 56 capsule; Refill: 0  3. Nausea and vomiting, unspecified vomiting type Patient not complaining of nausea or vomiting during interview, however did have episode of vomiting after blood draw.  Patient given Zofran in clinic.  Patient encouraged to increase hydration, supportive care - ondansetron (ZOFRAN) tablet 8 mg  4. History of hepatitis C  - HCV Ab w Reflex to Quant PCR  5. Alcohol abuse Currently in subs abuse treatment program  6. Marijuana abuse   7. Cocaine abuse ([place])   8. History of crack cocaine use   9. Tobacco abuse    I have reviewed the patient's medical history (PMH, PSH, Social History, Family History, Medications, and allergies) , and have been updated if relevant. I spent 30 minutes reviewing chart and  face to face time with patient.     Return in about 7 months (around 09/20/2023) for for Test of Cure for Syphlillis .   Loraine Grip Mayers, PA-C

## 2023-02-21 LAB — HCV RT-PCR, QUANT (NON-GRAPH)
HCV log10: 1.301 log10 IU/mL
Hepatitis C Quantitation: 20 IU/mL

## 2023-02-21 LAB — HCV AB W REFLEX TO QUANT PCR: HCV Ab: REACTIVE — AB

## 2023-02-21 NOTE — Addendum Note (Signed)
Addended by: Kennieth Rad on: 02/21/2023 09:33 AM   Modules accepted: Orders

## 2023-03-11 ENCOUNTER — Other Ambulatory Visit (HOSPITAL_COMMUNITY)
Admission: EM | Admit: 2023-03-11 | Discharge: 2023-03-16 | Disposition: A | Discharge status: Home / Self Care | Payer: Medicaid Other | Attending: Psychiatry | Admitting: Psychiatry

## 2023-03-11 DIAGNOSIS — Z1152 Encounter for screening for COVID-19: Secondary | ICD-10-CM | POA: Insufficient documentation

## 2023-03-11 DIAGNOSIS — F1994 Other psychoactive substance use, unspecified with psychoactive substance-induced mood disorder: Secondary | ICD-10-CM | POA: Diagnosis not present

## 2023-03-11 DIAGNOSIS — F1010 Alcohol abuse, uncomplicated: Secondary | ICD-10-CM | POA: Insufficient documentation

## 2023-03-11 DIAGNOSIS — F1414 Cocaine abuse with cocaine-induced mood disorder: Secondary | ICD-10-CM | POA: Insufficient documentation

## 2023-03-11 DIAGNOSIS — F1410 Cocaine abuse, uncomplicated: Secondary | ICD-10-CM

## 2023-03-11 LAB — COMPREHENSIVE METABOLIC PANEL
ALT: 36 U/L (ref 0–44)
AST: 44 U/L — ABNORMAL HIGH (ref 15–41)
Albumin: 3.5 g/dL (ref 3.5–5.0)
Alkaline Phosphatase: 53 U/L (ref 38–126)
Anion gap: 10 (ref 5–15)
BUN: 8 mg/dL (ref 6–20)
CO2: 20 mmol/L — ABNORMAL LOW (ref 22–32)
Calcium: 9 mg/dL (ref 8.9–10.3)
Chloride: 107 mmol/L (ref 98–111)
Creatinine, Ser: 0.74 mg/dL (ref 0.44–1.00)
GFR, Estimated: >60 mL/min (ref >60)
Glucose, Bld: 93 mg/dL (ref 70–99)
Potassium: 3.9 mmol/L (ref 3.5–5.1)
Sodium: 137 mmol/L (ref 135–145)
Total Bilirubin: 0.4 mg/dL (ref 0.3–1.2)
Total Protein: 7.2 g/dL (ref 6.5–8.1)

## 2023-03-11 LAB — URINALYSIS, COMPLETE (UACMP) WITH MICROSCOPIC
Bilirubin Urine: NEGATIVE
Glucose, UA: NEGATIVE mg/dL
Hgb urine dipstick: NEGATIVE
Ketones, ur: NEGATIVE mg/dL
Leukocytes,Ua: NEGATIVE
Nitrite: NEGATIVE
Protein, ur: NEGATIVE mg/dL
Specific Gravity, Urine: 1.025 (ref 1.005–1.030)
pH: 6 (ref 5.0–8.0)

## 2023-03-11 LAB — POCT URINE DRUG SCREEN - MANUAL ENTRY (I-SCREEN)
POC Amphetamine UR: NOT DETECTED
POC Buprenorphine (BUP): NOT DETECTED
POC Cocaine UR: POSITIVE — AB
POC Marijuana UR: POSITIVE — AB
POC Methadone UR: NOT DETECTED
POC Methamphetamine UR: NOT DETECTED
POC Morphine: NOT DETECTED
POC Oxazepam (BZO): NOT DETECTED
POC Oxycodone UR: NOT DETECTED
POC Secobarbital (BAR): NOT DETECTED

## 2023-03-11 LAB — POCT PREGNANCY, URINE: Preg Test, Ur: NEGATIVE

## 2023-03-11 LAB — CBC WITH DIFFERENTIAL/PLATELET
Abs Immature Granulocytes: 0.02 10*3/uL (ref 0.00–0.07)
Basophils Absolute: 0.1 10*3/uL (ref 0.0–0.1)
Basophils Relative: 1 %
Eosinophils Absolute: 0.1 10*3/uL (ref 0.0–0.5)
Eosinophils Relative: 1 %
HCT: 31.6 % — ABNORMAL LOW (ref 36.0–46.0)
Hemoglobin: 9.7 g/dL — ABNORMAL LOW (ref 12.0–15.0)
Immature Granulocytes: 0 %
Lymphocytes Relative: 24 %
Lymphs Abs: 2 10*3/uL (ref 0.7–4.0)
MCH: 21.5 pg — ABNORMAL LOW (ref 26.0–34.0)
MCHC: 30.7 g/dL (ref 30.0–36.0)
MCV: 69.9 fL — ABNORMAL LOW (ref 80.0–100.0)
Monocytes Absolute: 0.7 10*3/uL (ref 0.1–1.0)
Monocytes Relative: 9 %
Neutro Abs: 5.5 10*3/uL (ref 1.7–7.7)
Neutrophils Relative %: 65 %
Platelets: ADEQUATE 10*3/uL (ref 150–400)
RBC: 4.52 MIL/uL (ref 3.87–5.11)
RDW: 19.4 % — ABNORMAL HIGH (ref 11.5–15.5)
WBC: 8.4 10*3/uL (ref 4.0–10.5)
nRBC: 0 % (ref 0.0–0.2)

## 2023-03-11 LAB — POC SARS CORONAVIRUS 2 AG: SARSCOV2ONAVIRUS 2 AG: NEGATIVE

## 2023-03-11 LAB — SARS CORONAVIRUS 2 BY RT PCR: SARS Coronavirus 2 by RT PCR: NEGATIVE

## 2023-03-11 LAB — MAGNESIUM: Magnesium: 1.9 mg/dL (ref 1.7–2.4)

## 2023-03-11 MED ORDER — DM-GUAIFENESIN ER 30-600 MG PO TB12
1.0000 | ORAL_TABLET | Freq: Two times a day (BID) | ORAL | Status: DC | PRN
  Administered 2023-03-11: 1 via ORAL
  Filled 2023-03-11: qty 1

## 2023-03-11 MED ORDER — QUETIAPINE FUMARATE 50 MG PO TABS: 50.0000 mg | ORAL_TABLET | Freq: Every day | ORAL | Status: DC

## 2023-03-11 MED ORDER — ACETAMINOPHEN 325 MG PO TABS
650.0000 mg | ORAL_TABLET | Freq: Four times a day (QID) | ORAL | Status: DC | PRN
  Administered 2023-03-16: 650 mg via ORAL
  Filled 2023-03-11: qty 2

## 2023-03-11 MED ORDER — LOPERAMIDE HCL 2 MG PO CAPS: 2.0000 mg | ORAL_CAPSULE | ORAL | Status: AC | PRN

## 2023-03-11 MED ORDER — MAGNESIUM HYDROXIDE 400 MG/5ML PO SUSP: 30.0000 mL | Freq: Every day | ORAL | Status: DC | PRN

## 2023-03-11 MED ORDER — ADULT MULTIVITAMIN W/MINERALS CH
1.0000 | ORAL_TABLET | Freq: Every day | ORAL | Status: DC
  Administered 2023-03-11 – 2023-03-16 (×6): 1 via ORAL
  Filled 2023-03-11 (×6): qty 1

## 2023-03-11 MED ORDER — HYDROXYZINE HCL 25 MG PO TABS
25.0000 mg | ORAL_TABLET | Freq: Four times a day (QID) | ORAL | Status: AC | PRN
  Administered 2023-03-11: 25 mg via ORAL
  Filled 2023-03-11: qty 1

## 2023-03-11 MED ORDER — LORAZEPAM 1 MG PO TABS: 1.0000 mg | ORAL_TABLET | Freq: Four times a day (QID) | ORAL | Status: AC | PRN

## 2023-03-11 MED ORDER — THIAMINE HCL 100 MG/ML IJ SOLN
100.0000 mg | Freq: Once | INTRAMUSCULAR | Status: AC
  Administered 2023-03-11: 100 mg via INTRAMUSCULAR
  Filled 2023-03-11: qty 2

## 2023-03-11 MED ORDER — THIAMINE MONONITRATE 100 MG PO TABS
100.0000 mg | ORAL_TABLET | Freq: Every day | ORAL | Status: DC
  Administered 2023-03-12 – 2023-03-16 (×5): 100 mg via ORAL
  Filled 2023-03-11 (×5): qty 1

## 2023-03-11 MED ORDER — ONDANSETRON 4 MG PO TBDP: 4.0000 mg | ORAL_TABLET | Freq: Four times a day (QID) | ORAL | Status: AC | PRN

## 2023-03-11 MED ORDER — ALUM & MAG HYDROXIDE-SIMETH 200-200-20 MG/5ML PO SUSP: 30.0000 mL | ORAL | Status: DC | PRN

## 2023-03-11 NOTE — ED Notes (Addendum)
Pt is in the bed resting. Respirations are even and unlabored. No acute distress noted. Will continue to monitor for safety  

## 2023-03-11 NOTE — ED Notes (Signed)
Patient admitted to [facility] from [facility] for etoh detox.  Patient is calm and pleasant, organized and logical.  Patient is known to unit and was here in October.  Patient reports she went to [facility] but relapsed after.  Patient reports she wants recovery and so she returned here for detox.  Patient using etoh and cocaine.  Last use yesterday.  Patient reoriented to unit and shown to her room.  No evidence of active withdrawal at this time.  On ativan taper.  Will monitor for withdrawal and provide safe supportive environment.

## 2023-03-11 NOTE — ED Notes (Signed)
Pt is in the bed sleeping. Respirations are even and unlabored. No acute distress noted. Will continue to monitor for safety. 

## 2023-03-11 NOTE — Progress Notes (Signed)
   03/11/23 1559  [place] (Walk-ins at [facility] only)  What Is the Reason for Your Visit/Call Today? Donna Zamora is a 37 year old female presenting to [facility] voluntarily seeking rehab treatment for cocaine and alcohol use. Patient smoking about $200 worth of cocaine yesterday. Pt reports using cocaine daily and amount she uses varies. She has been using for about 5 years. Pt reports she drunk a 5th of liquor yesterday and reports she drinks a few shot every few months. Pt reports last rehab treatment was at [facility] a month ago. Pt reports dx of conduct disorder and schizophrenia and she has been off her medications (Seroquel) for the past few months. Pt reports financial stressors. Pt denies SI, HI, AVH.  How Long Has This Been Causing You Problems? > than 6 months  Have You Recently Had Any Thoughts About Hurting Yourself? No  Are You Planning to Commit Suicide/Harm Yourself At This time? No  Have you Recently Had Thoughts About [place]? No  Are You Planning To Harm Someone At This Time? No  Are you currently experiencing any auditory, visual or other hallucinations? No  Have You Used Any Alcohol or Drugs in the Past 24 Hours? Yes  How long ago did you use Drugs or Alcohol? yesterday  What Did You Use and How Much? $200 cocaine and 5th liquor  Do you have any current medical co-morbidities that require immediate attention? No  Clinician description of patient physical appearance/behavior: calm  What Do You Feel Would Help You the Most Today? Treatment for Depression or other mood problem  If access to [facility] was not available, would you have sought care in the Emergency Department? No  Determination of Need Routine (7 days)  Options For Referral Medication Management;Outpatient Therapy

## 2023-03-11 NOTE — ED Provider Notes (Cosign Needed Addendum)
Facility Based Crisis Admission H&P  Date: 03/11/23 Patient Name: Donna Zamora Current MRN: [id number] Chief Complaint: "I need detox and need to go to rehab"  Diagnoses:  Final diagnoses:  Substance induced mood disorder ([place])  Cocaine abuse ([place])  Alcohol abuse    HPI: patient presented to [facility] as a walk in alone with complaints of, "I need detox and need to go to rehab"  Donna Zamora, 37 y.o., female patient seen face to face by this provider and chart reviewed on 03/11/23. A self-reported psychiatric history of substance abuse, schizophrenia, multiple personalities, depression and PTSD. She reports multiple inpatient psychiatric hospitalizations for mental health. Last reported inpatient psychiatric hospitalization was a couple years ago.   Patient was admitted to the [facility] on 10/05/2022 with requesting detox and residential substance abuse treatment.  On day 3 she requested to be discharged because she was not ready to maintain her sobriety.  On evaluation Donna Zamora reports she has had multiple relapses but feels that she is in a place where she wants to make it work and to "get my life together".  She is seeking residential treatment for cocaine and alcohol abuse.  She admits to smoking $200 worth of cocaine yesterday.  She has used consistently for the past 5 years.  Her only break in sobriety was when she was admitted to the [facility] 09/2022 and last month when she was admitted to [facility] residential treatment.  She remained at [facility]  for 20 days and then she asked to be discharged.  She endorses drinking multiple shots per day of liquor.  She drank a fifth of liquor yesterday.  She has not taken any medications since her discharge from [facility].  She denies any history of alcohol withdrawal seizures or delirium tremors.  She is denying any withdrawal symptoms at this time.  During evaluation Donna Zamora is observed sitting in the assessment room in no acute distress.  She is disheveled and  makes good eye contact.  She has normal speech and behavior.  She is alert/oriented x 4, cooperative, and attentive.  She endorses an increase in her depression and anxiety with feelings of helplessness, worthlessness, guilt, decreased motivation, and decreased sleep.  She has a dysphoric affect.  She denies SI/HI/AVH. Objectively there is no evidence of psychosis/mania or delusional thinking.  Patient is able to converse coherently, goal directed thoughts, no distractibility, or pre-occupation. Patient answered question appropriately.    Discussed admission to the facility base crisis unit, explained the milieu and expectations.  Patient is in agreement.  Discussed with patient the positive syphilis test when she was admitted to the [facility] 10/08/2022.  Explained that the provider tried to call her to inform her that she had tested positive.  States while she was admitted to [facility] residential they have a mobile crisis clinic.  States that mobile crisis unit treated her for syphilis for 20 days with oral antibiotics.  However when she was discharged she was not provided any oral antibiotics.  Will review lab work once resulted and will follow-up with infectious disease  PHQ 2-9:  [place] ED from 03/11/2023 in [facility] ED from 10/05/2022 in [facility]  Thoughts that you would be better off dead, or of hurting yourself in some way Several days Several days  PHQ-9 Total Score 20 8       [place] ED from 03/11/2023 in [facility] Most recent reading at 03/11/2023  4:00 PM  ED from 10/04/2022 in [facility] Emergency Department at [facility] Most recent reading at 10/04/2022  8:24 PM ED from 10/04/2022 in [facility] Emergency Department at [facility] Most recent reading at 10/04/2022  5:55 PM  C-SSRS RISK CATEGORY No Risk High Risk No Risk        Total Time spent with patient: 30  minutes  Musculoskeletal  Strength & Muscle Tone: within normal limits Gait & Station: normal Patient leans: N/A  Psychiatric Specialty Exam  Presentation General Appearance:  Disheveled  Eye Contact: Good  Speech: Clear and Coherent; Normal Rate  Speech Volume: Normal  Handedness: Right   Mood and Affect  Mood: Anxious; Depressed  Affect: Appropriate   Thought Process  Thought Processes: Coherent  Descriptions of Associations:Intact  Orientation:Full (Time, Place and Person)  Thought Content:Logical  Diagnosis of Schizophrenia or Schizoaffective disorder in past: -- (self reported)   Hallucinations:Hallucinations: None  Ideas of Reference:None  Suicidal Thoughts:Suicidal Thoughts: No  Homicidal Thoughts:Homicidal Thoughts: No   Sensorium  Memory: Immediate Good; Remote Good; Recent Good  Judgment: Fair  Insight: Fair   Community education officer  Concentration: Good  Attention Span: Good  Recall: Good  Fund of Knowledge: Good  Language: Good   Psychomotor Activity  Psychomotor Activity: Psychomotor Activity: Normal   Assets  Assets: Communication Skills; Desire for Improvement; Physical Health; Resilience; Leisure Time   Sleep  Sleep: Sleep: Fair Number of Hours of Sleep: 6   Nutritional Assessment (For OBS and FBC admissions only) Has the patient had a weight loss or gain of 10 pounds or more in the last 3 months?: No Has the patient had a decrease in food intake/or appetite?: No Does the patient have dental problems?: No Does the patient have eating habits or behaviors that may be indicators of an eating disorder including binging or inducing vomiting?: No Has the patient recently lost weight without trying?: 0 Has the patient been eating poorly because of a decreased appetite?: 0 Malnutrition Screening Tool Score: 0    Physical Exam Vitals and nursing note reviewed.  Constitutional:      General: She is not in  acute distress.    Appearance: Normal appearance. She is not ill-appearing.  HENT:     Head: Normocephalic.  Eyes:     General:        Right eye: No discharge.        Left eye: No discharge.     Conjunctiva/sclera: Conjunctivae normal.  Cardiovascular:     Rate and Rhythm: Normal rate.  Pulmonary:     Effort: Pulmonary effort is normal.  Musculoskeletal:        General: Normal range of motion.     Cervical back: Normal range of motion.  Skin:    Coloration: Skin is not jaundiced or pale.  Neurological:     Mental Status: She is alert and oriented to person, place, and time.  Psychiatric:        Attention and Perception: Attention and perception normal.        Mood and Affect: Affect normal. Mood is anxious and depressed.        Speech: Speech normal.        Behavior: Behavior is cooperative.        Thought Content: Thought content normal.        Cognition and Memory: Cognition normal.        Judgment: Judgment is impulsive.    Review of Systems  Constitutional: Negative.  HENT: Negative.    Eyes: Negative.   Respiratory: Negative.    Cardiovascular: Negative.   Musculoskeletal: Negative.   Skin: Negative.   Neurological: Negative.   Psychiatric/Behavioral:  Positive for depression and substance abuse. The patient is nervous/anxious.     Blood pressure 128/85, pulse 72, temperature 98.4 F (36.9 C), temperature source Oral, resp. rate 18, last menstrual period 01/21/2023, SpO2 100 %, unknown if currently breastfeeding. There is no height or weight on file to calculate BMI.  Past Psychiatric History:   A self-reported psychiatric history of substance abuse, schizophrenia, multiple personalities, depression and PTSD. She reports multiple inpatient psychiatric hospitalizations for mental health. Last reported inpatient psychiatric hospitalization was a couple years ago.     Is the patient at risk to self? No  Has the patient been a risk to self in the past 6 months? No .     Has the patient been a risk to self within the distant past? No   Is the patient a risk to others? No   Has the patient been a risk to others in the past 6 months? No   Has the patient been a risk to others within the distant past? No   Past Medical History:  Ectopic pregnancy and cesarean section.  Family History: Reviewed no pertinent family history. Social History:  Unemployed and homeless, can stay with Grandmother at times. Completed 12 th grade  Has 7 children [facility] Substance Use Alcohol/Drug Use: Alcohol / Drug Use Pain Medications: SEE MAR Prescriptions: SEE MAR Over the Counter: SEE MAR History of alcohol / drug use?: Yes Negative Consequences of Use: Personal relationships, Work / School Withdrawal Symptoms: None Substance #1 Name of Substance 1: COCAINE 1 - Age of First Use: 28 1 - Amount (size/oz): VAIRES 1 - Frequency: DAILY 1 - Duration: ONGOING 1 - Last Use / Amount: YESTERDAY/$200 1 - Method of Aquiring: UNKNOWN 1- Route of Use: SMOKING Substance #2 Name of Substance 2: ALCOHOL 2 - Age of First Use: 14 2 - Amount (size/oz): COUPLE SHOTS 2 - Frequency: 1-2 MONTH 2 - Duration: ONGOING 2 - Last Use / Amount: YESTERDAY/FIFTH 2 - Method of Aquiring: UNKNONW 2 - Route of Substance Use: ORAL     Last Labs:  Office Visit on 02/18/2023  Component Date Value Ref Range Status   HCV Ab 02/18/2023 Reactive (A)  Non Reactive Final   Hepatitis C Quantitation 02/18/2023 20  IU/mL Final   HCV log10 02/18/2023 1.301  log10 IU/mL Final   Test Information (HCV): 02/18/2023 Comment   Final   The quantitative range of this assay is 15 IU/mL to 100 million IU/mL.   Interpretation (HCV): 02/18/2023 Comment   Final   Comment: Positive HCV antibody screen with the presence of HCV RNA is consistent with active infection.   Admission on 10/05/2022, Discharged on 10/08/2022  Component Date Value Ref Range Status   TSH 10/06/2022 0.804  0.350 - 4.500 uIU/mL Final    Comment: Performed by a 3rd Generation assay with a functional sensitivity of <=0.01 uIU/mL. Performed at [facility], [place] [address], [place], [place] [address]    Neisseria Gonorrhea 10/06/2022 Negative   Final   Chlamydia 10/06/2022 Negative   Final   Comment 10/06/2022 Normal Reference Ranger Chlamydia - Negative   Final   Comment 10/06/2022 Normal Reference Range Neisseria Gonorrhea - Negative   Final   RPR Ser Ql 10/06/2022 Reactive (A)  NON REACTIVE Final   SENT FOR CONFIRMATION  RPR Titer 10/06/2022 1:2   Final   Performed at [facility], [place] [address] [place], [place] [address]   HIV Screen 4th Generation wRfx 10/06/2022 Non Reactive  Non Reactive Final   Performed at [facility], [place] [address], [place], [place] [address]   Cholesterol 10/06/2022 160  0 - 200 mg/dL Final   Triglycerides 10/06/2022 78  <150 mg/dL Final   HDL 10/06/2022 50  >40 mg/dL Final   Total CHOL/HDL Ratio 10/06/2022 3.2  RATIO Final   VLDL 10/06/2022 16  0 - 40 mg/dL Final   LDL Cholesterol 10/06/2022 94  0 - 99 mg/dL Final   Comment:        Total Cholesterol/HDL:CHD Risk Coronary Heart Disease Risk Table                     Men   Women  1/2 Average Risk   3.4   3.3  Average Risk       5.0   4.4  2 X Average Risk   9.6   7.1  3 X Average Risk  23.4   11.0        Use the calculated Patient Ratio above and the CHD Risk Table to determine the patient's CHD Risk.        ATP III CLASSIFICATION (LDL):  <100     mg/dL   Optimal  100-129  mg/dL   Near or Above                    Optimal  130-159  mg/dL   Borderline  160-189  mg/dL   High  >190     mg/dL   Very High Performed at [place] [address] [place], [place] [address]    T Pallidum Abs 10/06/2022 Reactive (A)  Non Reactive Final   Comment: (NOTE) Performed At: [facility] [place], [place] [id number] Rush Farmer MD [id number]   Admission on 10/04/2022, Discharged on  10/05/2022  Component Date Value Ref Range Status   SARS Coronavirus 2 by RT PCR 10/04/2022 NEGATIVE  NEGATIVE Final   Comment: (NOTE) SARS-CoV-2 target nucleic acids are NOT DETECTED.  The SARS-CoV-2 RNA is generally detectable in upper respiratory specimens during the acute phase of infection. The lowest concentration of SARS-CoV-2 viral copies this assay can detect is 138 copies/mL. A negative result does not preclude SARS-Cov-2 infection and should not be used as the sole basis for treatment or other patient management decisions. A negative result may occur with  improper specimen collection/handling, submission of specimen other than nasopharyngeal swab, presence of viral mutation(s) within the areas targeted by this assay, and inadequate number of viral copies(<138 copies/mL). A negative result must be combined with clinical observations, patient history, and epidemiological information. The expected result is Negative.  Fact Sheet for Patients:  EntrepreneurPulse.com.au  Fact Sheet for Healthcare Providers:  IncredibleEmployment.be  This test is no                          t yet approved or cleared by the [place] FDA and  has been authorized for detection and/or diagnosis of SARS-CoV-2 by FDA under an Emergency Use Authorization (EUA). This EUA will remain  in effect (meaning this test can be used) for the duration of the COVID-19 declaration under Section 564(b)(1) of the Act, 21 U.S.C.section 360bbb-3(b)(1), unless the authorization is terminated  or revoked sooner.       Influenza A by PCR 10/04/2022 NEGATIVE  NEGATIVE Final   Influenza B by PCR 10/04/2022 NEGATIVE  NEGATIVE Final   Comment: (NOTE) The Xpert Xpress SARS-CoV-2/FLU/RSV plus assay is intended as an aid in the diagnosis of influenza from Nasopharyngeal swab specimens and should not be used as a sole basis for treatment. Nasal washings and aspirates are  unacceptable for Xpert Xpress SARS-CoV-2/FLU/RSV testing.  Fact Sheet for Patients: EntrepreneurPulse.com.au  Fact Sheet for Healthcare Providers: IncredibleEmployment.be  This test is not yet approved or cleared by the [place] FDA and has been authorized for detection and/or diagnosis of SARS-CoV-2 by FDA under an Emergency Use Authorization (EUA). This EUA will remain in effect (meaning this test can be used) for the duration of the COVID-19 declaration under Section 564(b)(1) of the Act, 21 U.S.C. section 360bbb-3(b)(1), unless the authorization is terminated or revoked.  Performed at [facility], [place] [address] [place], [place] [address]    Sodium 10/04/2022 141  135 - 145 mmol/L Final   Potassium 10/04/2022 3.4 (L)  3.5 - 5.1 mmol/L Final   Chloride 10/04/2022 112 (H)  98 - 111 mmol/L Final   CO2 10/04/2022 22  22 - 32 mmol/L Final   Glucose, Bld 10/04/2022 107 (H)  70 - 99 mg/dL Final   Glucose reference range applies only to samples taken after fasting for at least 8 hours.   BUN 10/04/2022 11  6 - 20 mg/dL Final   Creatinine, Ser 10/04/2022 0.69  0.44 - 1.00 mg/dL Final   Calcium 10/04/2022 8.9  8.9 - 10.3 mg/dL Final   Total Protein 10/04/2022 7.4  6.5 - 8.1 g/dL Final   Albumin 10/04/2022 3.4 (L)  3.5 - 5.0 g/dL Final   AST 10/04/2022 33  15 - 41 U/L Final   ALT 10/04/2022 34  0 - 44 U/L Final   Alkaline Phosphatase 10/04/2022 59  38 - 126 U/L Final   Total Bilirubin 10/04/2022 0.5  0.3 - 1.2 mg/dL Final   GFR, Estimated 10/04/2022 >60  >60 mL/min Final   Comment: (NOTE) Calculated using the CKD-EPI Creatinine Equation (2021)    Anion gap 10/04/2022 7  5 - 15 Final   Performed at [place] [address], [place], [place] [address]   Alcohol, Ethyl (B) 10/04/2022 <10  <10 mg/dL Final   Comment: (NOTE) Lowest detectable limit for serum alcohol is 10 mg/dL.  For medical purposes only. Performed at [facility], [place] [address] [place],  [address]    Opiates 10/04/2022 NONE DETECTED  NONE DETECTED Final   Cocaine 10/04/2022 POSITIVE (A)  NONE DETECTED Final   Benzodiazepines 10/04/2022 NONE DETECTED  NONE DETECTED Final   Amphetamines 10/04/2022 POSITIVE (A)  NONE DETECTED Final   Tetrahydrocannabinol 10/04/2022 POSITIVE (A)  NONE DETECTED Final   Barbiturates 10/04/2022 NONE DETECTED  NONE DETECTED Final   Comment: (NOTE) DRUG SCREEN FOR MEDICAL PURPOSES ONLY.  IF CONFIRMATION IS NEEDED FOR ANY PURPOSE, NOTIFY LAB WITHIN 5 DAYS.  LOWEST DETECTABLE LIMITS FOR URINE DRUG SCREEN Drug Class                     Cutoff (ng/mL) Amphetamine and metabolites    1000 Barbiturate and metabolites    200 Benzodiazepine                 200 Opiates and metabolites  300 Cocaine and metabolites        300 THC                            50 Performed at [facility], [place] [address], [place], [place] [address]    WBC 10/04/2022 11.5 (H)  4.0 - 10.5 K/uL Final   RBC 10/04/2022 3.91  3.87 - 5.11 MIL/uL Final   Hemoglobin 10/04/2022 8.1 (L)  12.0 - 15.0 g/dL Final   Comment: Reticulocyte Hemoglobin testing may be clinically indicated, consider ordering this additional test [id number]    HCT 10/04/2022 27.6 (L)  36.0 - 46.0 % Final   MCV 10/04/2022 70.6 (L)  80.0 - 100.0 fL Final   MCH 10/04/2022 20.7 (L)  26.0 - 34.0 pg Final   MCHC 10/04/2022 29.3 (L)  30.0 - 36.0 g/dL Final   RDW 10/04/2022 21.2 (H)  11.5 - 15.5 % Final   Platelets 10/04/2022 521 (H)  150 - 400 K/uL Final   nRBC 10/04/2022 0.0  0.0 - 0.2 % Final   Neutrophils Relative % 10/04/2022 72  % Final   Neutro Abs 10/04/2022 8.3 (H)  1.7 - 7.7 K/uL Final   Lymphocytes Relative 10/04/2022 21  % Final   Lymphs Abs 10/04/2022 2.5  0.7 - 4.0 K/uL Final   Monocytes Relative 10/04/2022 5  % Final   Monocytes Absolute 10/04/2022 0.6  0.1 - 1.0 K/uL Final   Eosinophils Relative 10/04/2022 1  % Final   Eosinophils  Absolute 10/04/2022 0.1  0.0 - 0.5 K/uL Final   Basophils Relative 10/04/2022 0  % Final   Basophils Absolute 10/04/2022 0.0  0.0 - 0.1 K/uL Final   Immature Granulocytes 10/04/2022 1  % Final   Abs Immature Granulocytes 10/04/2022 0.06  0.00 - 0.07 K/uL Final   Performed at [facility], [place] [address], [place], [place] [address]   I-stat hCG, quantitative 10/04/2022 <5.0  <5 mIU/mL Final   Comment 3 10/04/2022          Final   Comment:   GEST. AGE      CONC.  (mIU/mL)   <=1 WEEK        5 - 50     2 WEEKS       50 - 500     3 WEEKS       100 - 10,000     4 WEEKS     1,000 - 30,000        FEMALE AND NON-PREGNANT FEMALE:     LESS THAN 5 mIU/mL    Salicylate Lvl 0000000 <7.0 (L)  7.0 - 30.0 mg/dL Final   Performed at [facility], [place] [address], [place], [place] [address]   Acetaminophen (Tylenol), Serum 10/04/2022 <10 (L)  10 - 30 ug/mL Final   Comment: (NOTE) Therapeutic concentrations vary significantly. A range of 10-30 ug/mL  may be an effective concentration for many patients. However, some  are best treated at concentrations outside of this range. Acetaminophen concentrations >150 ug/mL at 4 hours after ingestion  and >50 ug/mL at 12 hours after ingestion are often associated with  toxic reactions.  Performed at [facility], [place] [address] [place] [address], [place], [place] [address]    Color, Urine 10/04/2022 YELLOW  YELLOW Final   APPearance 10/04/2022 HAZY (A)  CLEAR Final   Specific Gravity, Urine 10/04/2022 1.028  1.005 - 1.030 Final  pH 10/04/2022 6.0  5.0 - 8.0 Final   Glucose, UA 10/04/2022 NEGATIVE  NEGATIVE mg/dL Final   Hgb urine dipstick 10/04/2022 NEGATIVE  NEGATIVE Final   Bilirubin Urine 10/04/2022 NEGATIVE  NEGATIVE Final   Ketones, ur 10/04/2022 NEGATIVE  NEGATIVE mg/dL Final   Protein, ur 10/04/2022 30 (A)  NEGATIVE mg/dL Final   Nitrite 10/04/2022 NEGATIVE  NEGATIVE Final   Leukocytes,Ua 10/04/2022 NEGATIVE  NEGATIVE Final   RBC / HPF 10/04/2022  0-5  0 - 5 RBC/hpf Final   WBC, UA 10/04/2022 0-5  0 - 5 WBC/hpf Final   Bacteria, UA 10/04/2022 NONE SEEN  NONE SEEN Final   Squamous Epithelial / HPF 10/04/2022 6-10  0 - 5 Final   Mucus 10/04/2022 PRESENT   Final   Performed at [facility], [place] [address], [place], [place] [address]    Allergies: Penicillins and Risperidone and related  Medications:  Facility Ordered Medications  Medication   acetaminophen (TYLENOL) tablet 650 mg   alum & mag hydroxide-simeth (MAALOX/MYLANTA) 200-200-20 MG/5ML suspension 30 mL   magnesium hydroxide (MILK OF MAGNESIA) suspension 30 mL   thiamine (VITAMIN B1) injection 100 mg   [START ON 03/12/2023] thiamine (VITAMIN B1) tablet 100 mg   multivitamin with minerals tablet 1 tablet   LORazepam (ATIVAN) tablet 1 mg   hydrOXYzine (ATARAX) tablet 25 mg   loperamide (IMODIUM) capsule 2-4 mg   ondansetron (ZOFRAN-ODT) disintegrating tablet 4 mg   PTA Medications  Medication Sig   acyclovir (ZOVIRAX) 200 MG capsule Take 2 capsules (400 mg total) by mouth 3 (three) times daily.   QUEtiapine (SEROQUEL) 50 MG tablet Take 1 tablet (50 mg total) by mouth at bedtime.   benzonatate (TESSALON) 100 MG capsule Take 1-2 caps PO TID PRN   doxycycline (VIBRAMYCIN) 100 MG capsule Take 1 capsule (100 mg total) by mouth 2 (two) times daily for 28 days.    Long Term Goals: Improvement in symptoms so as ready for discharge  Short Term Goals: Patient will verbalize feelings in meetings with treatment team members., Patient will attend at least of 50% of the groups daily., Pt will complete the PHQ9 on admission, day 3 and discharge., Patient will participate in completing the Kelli[place], Patient will score a low risk of violence for 24 hours prior to discharge, and Patient will take medications as prescribed daily.  Medical Decision Making    Patient presents to [facility] requesting alcohol and cocaine detox.  She is seeking residential  substance abuse treatment.  She will be admitted to the facility base crisis unit.  She is on a high risk of relapse if discharged and will gradually benefit from admission to the [facility].  Recommendations  Based on my evaluation the patient does not appear to have an emergency medical condition.   Admit to the [facility] can help assist with residential treatment, patient is interested in [facility]  Mood Disorder:  -Seroquel 50 mg po QHS - will not start at this time QTC 499    Withdrawal: -CIWA, protocol with Ativan 1 mg every 6 hours for CIWA score greater than 8 -Imodium 2-4 mg PRN diarrhea -Zofran-ODT 4 mg q6 PRN nausea -Multivitamin daily for nutritional supplementation -Thiamine injection 100 mg and thiamin 100 mg po daily    Syphilis Will review lab work once resulted and follow-up with ID  Lab Orders         SARS Coronavirus 2 by RT PCR (hospital order, performed  in [facility] lab) *cepheid single result test* Anterior Nasal Swab         CBC with Differential/Platelet         Comprehensive metabolic panel         Hemoglobin A1c         Magnesium         Ethanol         RPR         Urinalysis, Complete w Microscopic -Urine, Clean Catch         HIV Antibody (routine testing w rflx)         POC urine preg, ED         POCT Urine Drug Screen - (I-Screen)      EKG- Prolonged QTC 499- will hold Seroquel for now.     Revonda Humphrey, NP 03/11/23  5:54 PM
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
## 2023-03-12 DIAGNOSIS — F1994 Other psychoactive substance use, unspecified with psychoactive substance-induced mood disorder: Secondary | ICD-10-CM | POA: Diagnosis not present

## 2023-03-12 DIAGNOSIS — Z1152 Encounter for screening for COVID-19: Secondary | ICD-10-CM | POA: Diagnosis not present

## 2023-03-12 DIAGNOSIS — F1010 Alcohol abuse, uncomplicated: Secondary | ICD-10-CM | POA: Diagnosis not present

## 2023-03-12 DIAGNOSIS — F1414 Cocaine abuse with cocaine-induced mood disorder: Secondary | ICD-10-CM | POA: Diagnosis not present

## 2023-03-12 MED ORDER — MELATONIN 3 MG PO TABS
3.0000 mg | ORAL_TABLET | Freq: Every evening | ORAL | Status: DC | PRN
  Administered 2023-03-12 – 2023-03-15 (×4): 3 mg via ORAL
  Filled 2023-03-12 (×4): qty 1

## 2023-03-12 NOTE — ED Provider Notes (Cosign Needed)
Behavioral Health Progress Note  Date and Time: 03/12/2023 11:35 AM Name: Donna Zamora MRN:  [id number]  Subjective:  Donna Zamora, 37 y.o., female with a self-reported psychiatric history of substance abuse, schizophrenia, multiple personalities, depression and PTSD.  On evaluation, patient is alert and oriented x 4. Her thought process is linear and speech is clear and coherent. Her mood is "good, much better" today.  She has good eye contact.  She denies SI/HI/AVH. There is no objective evidence that the patient is currently responding to internal or external stimuli.  Today she endorses some mild tremulousness but otherwise denies withdrawal sx.  She reports improved sleep, appetite, and energy levels today.   Patient inquires about the typical length of time for hearing from facilities, and her questions are answered.   LCSW advised that patient is unable to leave [facility] per probation officer, so she is working on other options for patient.    HPI: The patient presented to [facility] on 03/10/22 as a walk-in stating that she feels she is ready to receive treatment for her substance abuse.  She reports a history of cocaine and alcohol use for the last 5 years.  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  She was last admitted to [facility] in October of 2023 but left AMA due to desire to use.  She did self-present to  [facility] last month. She stayed for 20 days before leaving AMA and relapsing.  She did not continue her psychiatric medications when she left from [facility].  She denies history of withdrawal seizures.    Diagnosis:  Final diagnoses:  Substance induced mood disorder (HCC)  Cocaine abuse ([place])  Alcohol abuse    Total Time spent with patient: 30 minutes  Past Psychiatric History: self reported history of substance abuse, schizophrenia, multiple personality disorder, PTSD. multiple inpatient psychiatric hospitalizations, with last reported inpatient  psychiatric hospitalization a couple years ago.  Past Medical History: no significant medical history Family History: unknown Family Psychiatric  History: unknown Social History: Patient reports that she lives with a friend but this is a place where she gets high, no other housing options.  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  No communication with family.  Uses cocaine and alcohol.  Additional Social History:    Pain Medications: SEE MAR Prescriptions: SEE MAR Over the Counter: SEE MAR History of alcohol / drug use?: Yes Negative Consequences of Use: Personal relationships, Work / School Withdrawal Symptoms: None Name of Substance 1: COCAINE 1 - Age of First Use: 28 1 - Amount (size/oz): VAIRES 1 - Frequency: DAILY 1 - Duration: ONGOING 1 - Last Use / Amount: YESTERDAY/$200 1 - Method of Aquiring: UNKNOWN 1- Route of Use: SMOKING Name of Substance 2: ALCOHOL 2 - Age of First Use: 14 2 - Amount (size/oz): COUPLE SHOTS 2 - Frequency: 1-2 MONTH 2 - Duration: ONGOING 2 - Last Use / Amount: YESTERDAY/FIFTH 2 - Method of Aquiring: UNKNONW 2 - Route of Substance Use: ORAL                Sleep: Fair  Appetite:  Fair  Current Medications:  Current Facility-Administered Medications  Medication Dose Route Frequency Provider Last Rate Last Admin   acetaminophen (TYLENOL) tablet 650 mg  650 mg Oral Q6H PRN Revonda Humphrey, NP       alum & mag hydroxide-simeth (MAALOX/MYLANTA) 200-200-20 MG/5ML suspension 30 mL  30 mL Oral Q4H PRN Revonda Humphrey,  NP       dextromethorphan-guaiFENesin (MUCINEX DM) 30-600 MG per 12 hr tablet 1 tablet  1 tablet Oral BID PRN Onuoha, Chinwendu V, NP   1 tablet at 03/11/23 2326   hydrOXYzine (ATARAX) tablet 25 mg  25 mg Oral Q6H PRN Revonda Humphrey, NP   25 mg at 03/11/23 2215   loperamide (IMODIUM) capsule 2-4 mg  2-4 mg Oral PRN Revonda Humphrey, NP       LORazepam (ATIVAN) tablet 1 mg  1 mg Oral Q6H PRN Revonda Humphrey, NP       magnesium hydroxide (MILK OF MAGNESIA) suspension 30 mL  30 mL Oral Daily PRN Revonda Humphrey, NP       multivitamin with minerals tablet 1 tablet  1 tablet Oral Daily Revonda Humphrey, NP   1 tablet at 03/12/23 [id number]   ondansetron (ZOFRAN-ODT) disintegrating tablet 4 mg  4 mg Oral Q6H PRN Revonda Humphrey, NP       thiamine (VITAMIN B1) tablet 100 mg  100 mg Oral Daily Revonda Humphrey, NP   100 mg at 03/12/23 [id number]   Current Outpatient Medications  Medication Sig Dispense Refill   QUEtiapine (SEROQUEL) 50 MG tablet Take 1 tablet (50 mg total) by mouth at bedtime. (Patient not taking: Reported on 03/12/2023) 30 tablet 0    Labs  Lab Results:  Admission on 03/11/2023  Component Date Value Ref Range Status   SARS Coronavirus 2 by RT PCR 03/11/2023 NEGATIVE  NEGATIVE Final   Performed at [facility], [place] [address] [place],  [address]   WBC 03/11/2023 8.4  4.0 - 10.5 K/uL Final   RBC 03/11/2023 4.52  3.87 - 5.11 MIL/uL Final   Hemoglobin 03/11/2023 9.7 (L)  12.0 - 15.0 g/dL Final   HCT 03/11/2023 31.6 (L)  36.0 - 46.0 % Final   MCV 03/11/2023 69.9 (L)  80.0 - 100.0 fL Final   MCH 03/11/2023 21.5 (L)  26.0 - 34.0 pg Final   MCHC 03/11/2023 30.7  30.0 - 36.0 g/dL Final   RDW 03/11/2023 19.4 (H)  11.5 - 15.5 % Final   Platelets 03/11/2023 PLATELET CLUMPS NOTED ON SMEAR, COUNT APPEARS ADEQUATE  150 - 400 K/uL Final   nRBC 03/11/2023 0.0  0.0 - 0.2 % Final   Neutrophils Relative % 03/11/2023 65  % Final   Neutro Abs 03/11/2023 5.5  1.7 - 7.7 K/uL Final   Lymphocytes Relative 03/11/2023 24  % Final   Lymphs Abs 03/11/2023 2.0  0.7 - 4.0 K/uL Final   Monocytes Relative 03/11/2023 9  % Final   Monocytes Absolute 03/11/2023 0.7  0.1 - 1.0 K/uL Final   Eosinophils Relative 03/11/2023 1  % Final   Eosinophils Absolute 03/11/2023 0.1  0.0 - 0.5 K/uL Final   Basophils Relative 03/11/2023 1  % Final   Basophils Absolute 03/11/2023 0.1  0.0 - 0.1 K/uL Final   WBC  Morphology 03/11/2023 MORPHOLOGY UNREMARKABLE   Final   RBC Morphology 03/11/2023 MORPHOLOGY UNREMARKABLE   Final   Immature Granulocytes 03/11/2023 0  % Final   Abs Immature Granulocytes 03/11/2023 0.02  0.00 - 0.07 K/uL Final   Performed at [facility], [place] [address] [place], [place] [address]   Sodium 03/11/2023 137  135 - 145 mmol/L Final   Potassium 03/11/2023 3.9  3.5 - 5.1 mmol/L Final   Chloride 03/11/2023 107  98 - 111 mmol/L Final   CO2 03/11/2023  20 (L)  22 - 32 mmol/L Final   Glucose, Bld 03/11/2023 93  70 - 99 mg/dL Final   Glucose reference range applies only to samples taken after fasting for at least 8 hours.   BUN 03/11/2023 8  6 - 20 mg/dL Final   Creatinine, Ser 03/11/2023 0.74  0.44 - 1.00 mg/dL Final   Calcium 03/11/2023 9.0  8.9 - 10.3 mg/dL Final   Total Protein 03/11/2023 7.2  6.5 - 8.1 g/dL Final   Albumin 03/11/2023 3.5  3.5 - 5.0 g/dL Final   AST 03/11/2023 44 (H)  15 - 41 U/L Final   ALT 03/11/2023 36  0 - 44 U/L Final   Alkaline Phosphatase 03/11/2023 53  38 - 126 U/L Final   Total Bilirubin 03/11/2023 0.4  0.3 - 1.2 mg/dL Final   GFR, Estimated 03/11/2023 >60  >60 mL/min Final   Comment: (NOTE) Calculated using the CKD-EPI Creatinine Equation (2021)    Anion gap 03/11/2023 10  5 - 15 Final   Performed at [facility] [address], [place], [place] [address]   Magnesium 03/11/2023 1.9  1.7 - 2.4 mg/dL Final   Performed at [place] [address] [place], [place] [address]   POC Amphetamine UR 03/11/2023 None Detected  NONE DETECTED (Cut Off Level 1000 ng/mL) Final   POC Secobarbital (BAR) 03/11/2023 None Detected  NONE DETECTED (Cut Off Level 300 ng/mL) Final   POC Buprenorphine (BUP) 03/11/2023 None Detected  NONE DETECTED (Cut Off Level 10 ng/mL) Final   POC Oxazepam (BZO) 03/11/2023 None Detected  NONE DETECTED (Cut Off Level 300 ng/mL) Final   POC Cocaine UR 03/11/2023 Positive (A)  NONE DETECTED (Cut Off Level 300 ng/mL)  Final   POC Methamphetamine UR 03/11/2023 None Detected  NONE DETECTED (Cut Off Level 1000 ng/mL) Final   POC Morphine 03/11/2023 None Detected  NONE DETECTED (Cut Off Level 300 ng/mL) Final   POC Methadone UR 03/11/2023 None Detected  NONE DETECTED (Cut Off Level 300 ng/mL) Final   POC Oxycodone UR 03/11/2023 None Detected  NONE DETECTED (Cut Off Level 100 ng/mL) Final   POC Marijuana UR 03/11/2023 Positive (A)  NONE DETECTED (Cut Off Level 50 ng/mL) Final   Color, Urine 03/11/2023 YELLOW  YELLOW Final   APPearance 03/11/2023 CLOUDY (A)  CLEAR Final   Specific Gravity, Urine 03/11/2023 1.025  1.005 - 1.030 Final   pH 03/11/2023 6.0  5.0 - 8.0 Final   Glucose, UA 03/11/2023 NEGATIVE  NEGATIVE mg/dL Final   Hgb urine dipstick 03/11/2023 NEGATIVE  NEGATIVE Final   Bilirubin Urine 03/11/2023 NEGATIVE  NEGATIVE Final   Ketones, ur 03/11/2023 NEGATIVE  NEGATIVE mg/dL Final   Protein, ur 03/11/2023 NEGATIVE  NEGATIVE mg/dL Final   Nitrite 03/11/2023 NEGATIVE  NEGATIVE Final   Leukocytes,Ua 03/11/2023 NEGATIVE  NEGATIVE Final   RBC / HPF 03/11/2023 0-5  0 - 5 RBC/hpf Final   WBC, UA 03/11/2023 0-5  0 - 5 WBC/hpf Final   Bacteria, UA 03/11/2023 RARE (A)  NONE SEEN Final   Squamous Epithelial / HPF 03/11/2023 21-50  0 - 5 /HPF Final   Mucus 03/11/2023 PRESENT   Final   Performed at [facility], [place] [address] [place], [place] [address]   SARSCOV2ONAVIRUS 2 AG 03/11/2023 NEGATIVE  NEGATIVE Final   Comment: (NOTE) SARS-CoV-2 antigen NOT DETECTED.   Negative results are presumptive.  Negative results do not preclude SARS-CoV-2 infection and should not be used  as the sole basis for treatment or other patient management decisions, including infection  control decisions, particularly in the presence of clinical signs and  symptoms consistent with COVID-19, or in those who have been in contact with the virus.  Negative results must be combined with clinical observations, patient history, and  epidemiological information. The expected result is Negative.  Fact Sheet for Patients: HandmadeRecipes.com.cy  Fact Sheet for Healthcare Providers: FuneralLife.at  This test is not yet approved or cleared by the [place] FDA and  has been authorized for detection and/or diagnosis of SARS-CoV-2 by FDA under an Emergency Use Authorization (EUA).  This EUA will remain in effect (meaning this test can be used) for the duration of  the COV                          ID-19 declaration under Section 564(b)(1) of the Act, 21 U.S.C. section 360bbb-3(b)(1), unless the authorization is terminated or revoked sooner.     Preg Test, Ur 03/11/2023 NEGATIVE  NEGATIVE Final   Comment:        THE SENSITIVITY OF THIS METHODOLOGY IS >24 mIU/mL   Office Visit on 02/18/2023  Component Date Value Ref Range Status   HCV Ab 02/18/2023 Reactive (A)  Non Reactive Final   Hepatitis C Quantitation 02/18/2023 20  IU/mL Final   HCV log10 02/18/2023 1.301  log10 IU/mL Final   Test Information (HCV): 02/18/2023 Comment   Final   The quantitative range of this assay is 15 IU/mL to 100 million IU/mL.   Interpretation (HCV): 02/18/2023 Comment   Final   Comment: Positive HCV antibody screen with the presence of HCV RNA is consistent with active infection.   Admission on 10/05/2022, Discharged on 10/08/2022  Component Date Value Ref Range Status   TSH 10/06/2022 0.804  0.350 - 4.500 uIU/mL Final   Comment: Performed by a 3rd Generation assay with a functional sensitivity of <=0.01 uIU/mL. Performed at [facility], [place] [address], [place], [place] [address]    Neisseria Gonorrhea 10/06/2022 Negative   Final   Chlamydia 10/06/2022 Negative   Final   Comment 10/06/2022 Normal Reference Ranger Chlamydia - Negative   Final   Comment 10/06/2022 Normal Reference Range Neisseria Gonorrhea - Negative   Final   RPR Ser Ql 10/06/2022 Reactive (A)  NON REACTIVE  Final   SENT FOR CONFIRMATION   RPR Titer 10/06/2022 1:2   Final   Performed at [facility], [place] [address], [place], [place] [address]   HIV Screen 4th Generation wRfx 10/06/2022 Non Reactive  Non Reactive Final   Performed at [facility], [place] [address], [place], [place] [address]   Cholesterol 10/06/2022 160  0 - 200 mg/dL Final   Triglycerides 10/06/2022 78  <150 mg/dL Final   HDL 10/06/2022 50  >40 mg/dL Final   Total CHOL/HDL Ratio 10/06/2022 3.2  RATIO Final   VLDL 10/06/2022 16  0 - 40 mg/dL Final   LDL Cholesterol 10/06/2022 94  0 - 99 mg/dL Final   Comment:        Total Cholesterol/HDL:CHD Risk Coronary Heart Disease Risk Table                     Men   Women  1/2 Average Risk   3.4   3.3  Average Risk       5.0   4.4  2 X Average Risk  9.6   7.1  3 X Average Risk  23.4   11.0        Use the calculated Patient Ratio above and the CHD Risk Table to determine the patient's CHD Risk.        ATP III CLASSIFICATION (LDL):  <100     mg/dL   Optimal  100-129  mg/dL   Near or Above                    Optimal  130-159  mg/dL   Borderline  160-189  mg/dL   High  >190     mg/dL   Very High Performed at [facility] [address], [place], [place] [address]    T Pallidum Abs 10/06/2022 Reactive (A)  Non Reactive Final   Comment: (NOTE) Performed At: [facility] [place], [place] [id number] Rush Farmer MD [id number]   Admission on 10/04/2022, Discharged on 10/05/2022  Component Date Value Ref Range Status   SARS Coronavirus 2 by RT PCR 10/04/2022 NEGATIVE  NEGATIVE Final   Comment: (NOTE) SARS-CoV-2 target nucleic acids are NOT DETECTED.  The SARS-CoV-2 RNA is generally detectable in upper respiratory specimens during the acute phase of infection. The lowest concentration of SARS-CoV-2 viral copies this assay can detect is 138 copies/mL. A negative result does not preclude SARS-Cov-2 infection and should not be used as  the sole basis for treatment or other patient management decisions. A negative result may occur with  improper specimen collection/handling, submission of specimen other than nasopharyngeal swab, presence of viral mutation(s) within the areas targeted by this assay, and inadequate number of viral copies(<138 copies/mL). A negative result must be combined with clinical observations, patient history, and epidemiological information. The expected result is Negative.  Fact Sheet for Patients:  EntrepreneurPulse.com.au  Fact Sheet for Healthcare Providers:  IncredibleEmployment.be  This test is no                          t yet approved or cleared by the [place] FDA and  has been authorized for detection and/or diagnosis of SARS-CoV-2 by FDA under an Emergency Use Authorization (EUA). This EUA will remain  in effect (meaning this test can be used) for the duration of the COVID-19 declaration under Section 564(b)(1) of the Act, 21 U.S.C.section 360bbb-3(b)(1), unless the authorization is terminated  or revoked sooner.       Influenza A by PCR 10/04/2022 NEGATIVE  NEGATIVE Final   Influenza B by PCR 10/04/2022 NEGATIVE  NEGATIVE Final   Comment: (NOTE) The Xpert Xpress SARS-CoV-2/FLU/RSV plus assay is intended as an aid in the diagnosis of influenza from Nasopharyngeal swab specimens and should not be used as a sole basis for treatment. Nasal washings and aspirates are unacceptable for Xpert Xpress SARS-CoV-2/FLU/RSV testing.  Fact Sheet for Patients: EntrepreneurPulse.com.au  Fact Sheet for Healthcare Providers: IncredibleEmployment.be  This test is not yet approved or cleared by the [place] FDA and has been authorized for detection and/or diagnosis of SARS-CoV-2 by FDA under an Emergency Use Authorization (EUA). This EUA will remain in effect (meaning this test can be used) for the duration of  the COVID-19 declaration under Section 564(b)(1) of the Act, 21 U.S.C. section 360bbb-3(b)(1), unless the authorization is terminated or revoked.  Performed at [facility], [place] [address], [place], [place] [address]    Sodium 10/04/2022 141  135 - 145 mmol/L Final  Potassium 10/04/2022 3.4 (L)  3.5 - 5.1 mmol/L Final   Chloride 10/04/2022 112 (H)  98 - 111 mmol/L Final   CO2 10/04/2022 22  22 - 32 mmol/L Final   Glucose, Bld 10/04/2022 107 (H)  70 - 99 mg/dL Final   Glucose reference range applies only to samples taken after fasting for at least 8 hours.   BUN 10/04/2022 11  6 - 20 mg/dL Final   Creatinine, Ser 10/04/2022 0.69  0.44 - 1.00 mg/dL Final   Calcium 10/04/2022 8.9  8.9 - 10.3 mg/dL Final   Total Protein 10/04/2022 7.4  6.5 - 8.1 g/dL Final   Albumin 10/04/2022 3.4 (L)  3.5 - 5.0 g/dL Final   AST 10/04/2022 33  15 - 41 U/L Final   ALT 10/04/2022 34  0 - 44 U/L Final   Alkaline Phosphatase 10/04/2022 59  38 - 126 U/L Final   Total Bilirubin 10/04/2022 0.5  0.3 - 1.2 mg/dL Final   GFR, Estimated 10/04/2022 >60  >60 mL/min Final   Comment: (NOTE) Calculated using the CKD-EPI Creatinine Equation (2021)    Anion gap 10/04/2022 7  5 - 15 Final   Performed at [facility] [address], [place], [place] [address]   Alcohol, Ethyl (B) 10/04/2022 <10  <10 mg/dL Final   Comment: (NOTE) Lowest detectable limit for serum alcohol is 10 mg/dL.  For medical purposes only. Performed at [facility], [place] [address] [place], [place] [address]    Opiates 10/04/2022 NONE DETECTED  NONE DETECTED Final   Cocaine 10/04/2022 POSITIVE (A)  NONE DETECTED Final   Benzodiazepines 10/04/2022 NONE DETECTED  NONE DETECTED Final   Amphetamines 10/04/2022 POSITIVE (A)  NONE DETECTED Final   Tetrahydrocannabinol 10/04/2022 POSITIVE (A)  NONE DETECTED Final   Barbiturates 10/04/2022 NONE DETECTED  NONE DETECTED Final   Comment: (NOTE) DRUG SCREEN FOR MEDICAL PURPOSES ONLY.   IF CONFIRMATION IS NEEDED FOR ANY PURPOSE, NOTIFY LAB WITHIN 5 DAYS.  LOWEST DETECTABLE LIMITS FOR URINE DRUG SCREEN Drug Class                     Cutoff (ng/mL) Amphetamine and metabolites    1000 Barbiturate and metabolites    200 Benzodiazepine                 200 Opiates and metabolites        300 Cocaine and metabolites        300 THC                            50 Performed at [facility], [place] [address] [place], [place] [address]    WBC 10/04/2022 11.5 (H)  4.0 - 10.5 K/uL Final   RBC 10/04/2022 3.91  3.87 - 5.11 MIL/uL Final   Hemoglobin 10/04/2022 8.1 (L)  12.0 - 15.0 g/dL Final   Comment: Reticulocyte Hemoglobin testing may be clinically indicated, consider ordering this additional test [id number]    HCT 10/04/2022 27.6 (L)  36.0 - 46.0 % Final   MCV 10/04/2022 70.6 (L)  80.0 - 100.0 fL Final   MCH 10/04/2022 20.7 (L)  26.0 - 34.0 pg Final   MCHC 10/04/2022 29.3 (L)  30.0 - 36.0 g/dL Final   RDW 10/04/2022 21.2 (H)  11.5 - 15.5 % Final   Platelets 10/04/2022 521 (H)  150 - 400 K/uL Final   nRBC 10/04/2022 0.0  0.0 - 0.2 % Final   Neutrophils Relative % 10/04/2022 72  % Final   Neutro Abs 10/04/2022 8.3 (H)  1.7 - 7.7 K/uL Final   Lymphocytes Relative 10/04/2022 21  % Final   Lymphs Abs 10/04/2022 2.5  0.7 - 4.0 K/uL Final   Monocytes Relative 10/04/2022 5  % Final   Monocytes Absolute 10/04/2022 0.6  0.1 - 1.0 K/uL Final   Eosinophils Relative 10/04/2022 1  % Final   Eosinophils Absolute 10/04/2022 0.1  0.0 - 0.5 K/uL Final   Basophils Relative 10/04/2022 0  % Final   Basophils Absolute 10/04/2022 0.0  0.0 - 0.1 K/uL Final   Immature Granulocytes 10/04/2022 1  % Final   Abs Immature Granulocytes 10/04/2022 0.06  0.00 - 0.07 K/uL Final   Performed at [facility], [place] [address], [place], [place] [address]   I-stat hCG, quantitative 10/04/2022 <5.0  <5 mIU/mL Final   Comment 3 10/04/2022          Final   Comment:   GEST. AGE      CONC.  (mIU/mL)    <=1 WEEK        5 - 50     2 WEEKS       50 - 500     3 WEEKS       100 - 10,000     4 WEEKS     1,000 - 30,000        FEMALE AND NON-PREGNANT FEMALE:     LESS THAN 5 mIU/mL    Salicylate Lvl 0000000 <7.0 (L)  7.0 - 30.0 mg/dL Final   Performed at [facility], [place] [address], [place], [place] [address]   Acetaminophen (Tylenol), Serum 10/04/2022 <10 (L)  10 - 30 ug/mL Final   Comment: (NOTE) Therapeutic concentrations vary significantly. A range of 10-30 ug/mL  may be an effective concentration for many patients. However, some  are best treated at concentrations outside of this range. Acetaminophen concentrations >150 ug/mL at 4 hours after ingestion  and >50 ug/mL at 12 hours after ingestion are often associated with  toxic reactions.  Performed at [facility], [place] [address] [place], [place] [address]    Color, Urine 10/04/2022 YELLOW  YELLOW Final   APPearance 10/04/2022 HAZY (A)  CLEAR Final   Specific Gravity, Urine 10/04/2022 1.028  1.005 - 1.030 Final   pH 10/04/2022 6.0  5.0 - 8.0 Final   Glucose, UA 10/04/2022 NEGATIVE  NEGATIVE mg/dL Final   Hgb urine dipstick 10/04/2022 NEGATIVE  NEGATIVE Final   Bilirubin Urine 10/04/2022 NEGATIVE  NEGATIVE Final   Ketones, ur 10/04/2022 NEGATIVE  NEGATIVE mg/dL Final   Protein, ur 10/04/2022 30 (A)  NEGATIVE mg/dL Final   Nitrite 10/04/2022 NEGATIVE  NEGATIVE Final   Leukocytes,Ua 10/04/2022 NEGATIVE  NEGATIVE Final   RBC / HPF 10/04/2022 0-5  0 - 5 RBC/hpf Final   WBC, UA 10/04/2022 0-5  0 - 5 WBC/hpf Final   Bacteria, UA 10/04/2022 NONE SEEN  NONE SEEN Final   Squamous Epithelial / HPF 10/04/2022 6-10  0 - 5 Final   Mucus 10/04/2022 PRESENT   Final   Performed at [facility], [place] [address] [place], [place] [address]    Blood Alcohol level:  Lab Results  Component Value Date   ETH <10 0000000    Metabolic Disorder Labs: No results found for: "HGBA1C", "MPG" No results found for:  "PROLACTIN" Lab Results  Component Value Date   CHOL 160 10/06/2022   TRIG 78 10/06/2022   HDL 50 10/06/2022   CHOLHDL 3.2 10/06/2022   VLDL 16 10/06/2022   LDLCALC 94 10/06/2022    Therapeutic Lab Levels: No results found for: "LITHIUM" No results found for: "VALPROATE" No results found for: "CBMZ"  Physical Findings   PHQ2-9    Flowsheet Row ED from 03/11/2023 in [facility] ED from 10/05/2022 in [facility]  PHQ-2 Total Score 6 2  PHQ-9 Total Score 20 8      [place] ED from 03/11/2023 in [facility] Most recent reading at 03/11/2023  6:30 PM ED from 10/04/2022 in [facility] Emergency Department at [facility] Most recent reading at 10/04/2022  8:24 PM ED from 10/04/2022 in [facility] Emergency Department at [facility] Most recent reading at 10/04/2022  5:55 PM  C-SSRS RISK CATEGORY No Risk High Risk No Risk        Musculoskeletal  Strength & Muscle Tone: within normal limits Gait & Station: normal Patient leans: N/A  Psychiatric Specialty Exam  Presentation  General Appearance:  Tired and restless  Eye Contact: Good  Speech: Normal Rate  Speech Volume: Normal  Handedness: Right   Mood and Affect  Mood: Euthymic  Affect: Congruent   Thought Process  Thought Processes: Coherent  Descriptions of Associations:Intact  Orientation:Full (Time, Place and Person)  Thought Content:Logical  Diagnosis of Schizophrenia or Schizoaffective disorder in past: Yes (self reported)    Hallucinations:Hallucinations: None  Ideas of Reference:None  Suicidal Thoughts:Suicidal Thoughts: No  Homicidal Thoughts:Homicidal Thoughts: No   Sensorium  Memory: Immediate Good; Recent Good; Remote Good  Judgment: Fair  Insight: Fair   Materials engineer: Fair  Attention Span: Fair  Recall: Good  Fund of  Knowledge: Good  Language: Good   Psychomotor Activity  Psychomotor Activity: Psychomotor Activity: Restlessness; Tremor   Assets  Assets: Communication Skills; Desire for Improvement; Physical Health; Resilience; Leisure Time   Sleep  Sleep: Fair    Nutritional Assessment (For OBS and FBC admissions only) Has the patient had a weight loss or gain of 10 pounds or more in the last 3 months?: No Has the patient had a decrease in food intake/or appetite?: Yes Does the patient have dental problems?: No Does the patient have eating habits or behaviors that may be indicators of an eating disorder including binging or inducing vomiting?: No Has the patient recently lost weight without trying?: 0 Has the patient been eating poorly because of a decreased appetite?: 1 Malnutrition Screening Tool Score: 1    Physical Exam  Physical Exam Vitals reviewed.  Constitutional:      General: She is not in acute distress.    Appearance: She is not toxic-appearing.  HENT:     Head: Normocephalic.  Pulmonary:     Effort: Pulmonary effort is normal. No respiratory distress.  Neurological:     Mental Status: She is oriented to person, place, and time.     Motor: No weakness.     Gait: Gait normal.    Review of Systems  Constitutional:  Negative for malaise/fatigue.  Respiratory:  Negative for shortness of breath.   Cardiovascular:  Negative for chest pain.  Gastrointestinal:  Negative for abdominal pain, constipation, diarrhea, nausea and vomiting.  Neurological:  Positive for tremors and headaches. Negative for dizziness.   Blood pressure 122/74, pulse 81, temperature 98.3 F (36.8 C), temperature  source Oral, resp. rate 16, last menstrual period 01/21/2023, SpO2 99 %, unknown if currently breastfeeding. There is no height or weight on file to calculate BMI.  Treatment Plan Summary: Daily contact with patient to assess and evaluate symptoms and progress in treatment and  Medication management  AUD - CIWA protocol for monitoring of withdrawal with po thiamine and MVI replacement and Ativan 1mg  for scores >10   H/o Schizophrenia Depressive episode - Restart home Seroquel 50 mg qHS  Dispo: Pending  Rosezetta Schlatter, MD 03/13/2023, 12:20 PM

## 2023-03-12 NOTE — ED Notes (Signed)
Patient has been asleep most of the day.  No report of withdrawal symptoms.  She has been calm and cooperative however did not eat breakfast or lunch.  Patient encouraged to eat dinner and has agreed to same.  Patient makes needs known.  Will monitor and provide a safe environment for her.

## 2023-03-12 NOTE — ED Notes (Signed)
Patient observed/assessed in room in bed appearing in no immediate distress resting peacefully. Q15 minute checks continued by MHT and nursing staff. Will continue to monitor and support. 

## 2023-03-12 NOTE — Tx Team (Signed)
LCSW met with patient to assess current mood, affect, physical state, and inquire about needs/goals while here in [facility] and after discharge. Patient reports she presented due to needing to detox from cocaine and alcohol use. Patient reports "I am just tired of getting high and drunk all the time". Patient reports she has been living with a friend which is where she gets high. Patient reports she has no income and no social support. 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 Patient reports her mood has been "sketchy" stating its up and down depending on her drug use. Patient reports having a poor appetite and a hard time falling and staying asleep. Patient reports her drug use varies every day. Per admission note, "Donna Zamora is a 37 year old female presenting to [facility] voluntarily seeking rehab treatment for cocaine and alcohol use. Patient reports smoking about $200 worth of cocaine yesterday. Pt reports using cocaine daily and amount she uses varies. She has been using for about 5 years. Pt reports she drunk a 5th of liquor yesterday and reports she drinks a few shot every few months. Pt reports last rehab treatment was at [facility] a month ago. Pt reports dx of conduct disorder and schizophrenia and she has been off her medications (Seroquel) for the past few months. Pt reports financial stressors. Pt denies SI, HI, AVH". Patient reports her current goal is to seek residential placement for substance use. Patient denies any prior history of outpatient substance abuse treatment. Patient reports she was just at [facility] about a month ago, however left because she wanted to get high. LCSW and MD encouraged the patient to focus on her "why" towards recovery, with hopes that will keep her motivated towards taking steps to help herself. Patient aware that LCSW will send referrals out for review and will follow up to provide updates as received. Patient  expressed understanding and appreciation of LCSW assistance. No other needs were reported at this time by patient.   Referral to be sent to [facility] [place] for review. LCSW will also follow up with [facility] [place] regarding bed availability.    Lucius Conn, LCSW Clinical Social Worker [place] [facility] Ph: [phone]

## 2023-03-12 NOTE — ED Notes (Signed)
Pt is in the bed resting. Respirations are even and unlabored. No acute distress noted. Will continue to monitor for safety  

## 2023-03-12 NOTE — ED Notes (Addendum)
Patient was offered breakfast for the second time but patient did not get up

## 2023-03-12 NOTE — ED Notes (Signed)
Patient was provided with dinner

## 2023-03-13 DIAGNOSIS — F1994 Other psychoactive substance use, unspecified with psychoactive substance-induced mood disorder: Secondary | ICD-10-CM | POA: Diagnosis not present

## 2023-03-13 DIAGNOSIS — F1414 Cocaine abuse with cocaine-induced mood disorder: Secondary | ICD-10-CM | POA: Diagnosis not present

## 2023-03-13 DIAGNOSIS — Z1152 Encounter for screening for COVID-19: Secondary | ICD-10-CM | POA: Diagnosis not present

## 2023-03-13 DIAGNOSIS — F1010 Alcohol abuse, uncomplicated: Secondary | ICD-10-CM | POA: Diagnosis not present

## 2023-03-13 LAB — HEMOGLOBIN A1C
Hgb A1c MFr Bld: 5.6 % (ref 4.8–5.6)
Mean Plasma Glucose: 114 mg/dL

## 2023-03-13 LAB — HIV ANTIBODY (ROUTINE TESTING W REFLEX): HIV Screen 4th Generation wRfx: NONREACTIVE

## 2023-03-13 MED ORDER — POLYETHYLENE GLYCOL 3350 17 G PO PACK
17.0000 g | PACK | Freq: Once | ORAL | Status: AC
  Administered 2023-03-13: 17 g via ORAL
  Filled 2023-03-13: qty 1

## 2023-03-13 MED ORDER — QUETIAPINE FUMARATE 50 MG PO TABS
50.0000 mg | ORAL_TABLET | Freq: Every day | ORAL | Status: DC
  Administered 2023-03-13 – 2023-03-15 (×3): 50 mg via ORAL
  Filled 2023-03-13 (×3): qty 1

## 2023-03-13 NOTE — ED Notes (Signed)
Patient is sleeping. Respirations equal and unlabored, skin warm and dry, NAD. No change in assessment or acuity. Routine safety checks conducted according to facility protocol. Will continue to monitor for safety.   

## 2023-03-13 NOTE — Discharge Planning (Signed)
LCSW received phone call from Officer Mock patient's PO [phone]. Per Risk manager, he received phone call from patient that LCSW was seeking residential placement at this time. Per Risk manager, patient is unable to leave the county at this time. PO reports the patient is under the care of [place] Coordinator [phone] and [phone] ([phone]) and he advised LCSW to speak with her.   LCSW contacted [facility] regarding current plan. Per Mrs. Hines, she has been attempting to locate patient in order to seek placement for her. Mrs. Hines reports the patient is currently on probation and unable to leave county unless approved, however reports that process can be long depending on arrangements. Mrs. Hines reports the patient was just recently at [facility] however left AMA and likely did not leave a good impression when she left. Team has been looking at possibly getting her connected to [facility] however reports that is too much familiarity for the patient. Team believes the patient needs something that will focus on both mental health and substance use. Team has looked at getting the patient into [facility], however have not been able to find patient to start process. Mrs. Derrel Nip was made aware that referral has been sent to [facility] for review. Per [place], agency works will with [facility] and hopes the patient can be accepted there. Updates to be provided as received. Mrs. Hines stated that the patient is likely dealing with a lot of trauma and grief around the passing of her brother. Mrs. Hines reports the patient is very guarded about the information. Mrs. Derrel Nip reports brother was living with patient at one point, however patient kicked him out due to using drugs with kids in the home. Per [place], the night she kicked her brother out was also the night he overdosed of drugs. Mrs. Hines reports she believes the patient needs trauma related therapy as well. 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 Per Mrs. [facility] [facility] [employer], this is a huge issue as they are also the providers of drug addiction.   Mrs. Derrel Nip was made aware of LCSW efforts to seek placement. With current probation restrictions, placement will be limited. LCSW followed up with [facility] for updates. Per Elmo Putt, referral has been received and advised patient to call in to complete phone screening. Number provided to patient for her follow up. No other needs to report at this time.    Lucius Conn, LCSW Clinical Social Worker [place] [facility] Ph: [phone]

## 2023-03-13 NOTE — ED Notes (Signed)
Pt sitting in dayroom interacting with peers. No acute distress noted. No concerns voiced. Informed pt to notify staff with any needs or assistance. Pt verbalized understanding or agreement. Will continue to monitor for safety. 

## 2023-03-13 NOTE — ED Notes (Signed)
Patient is currently on the phone, no distress noted, will continue to monitor patient for safety 

## 2023-03-13 NOTE — Discharge Planning (Signed)
Referral were sent to the following facilities for review:   [facility]: awaiting update regarding referral [facility]: asked to refax referral for review [facility]: application was completed by patient on this morning and will be sent to Admissions Coordinator for review.  [facility]: Per Coralyn Mark, currently no beds available.  [facility]: received no answer [facility]: received no answer   LCSW will continue to follow and provide updates as received.   Lucius Conn, LCSW Clinical Social Worker [place] [facility] Ph: [phone]

## 2023-03-13 NOTE — ED Notes (Signed)
Pt is currently sleeping, no distress noted, environmental check complete, will continue to monitor patient for safety. ? ?

## 2023-03-14 DIAGNOSIS — F1414 Cocaine abuse with cocaine-induced mood disorder: Secondary | ICD-10-CM | POA: Diagnosis not present

## 2023-03-14 DIAGNOSIS — F1994 Other psychoactive substance use, unspecified with psychoactive substance-induced mood disorder: Secondary | ICD-10-CM | POA: Diagnosis not present

## 2023-03-14 DIAGNOSIS — Z1152 Encounter for screening for COVID-19: Secondary | ICD-10-CM | POA: Diagnosis not present

## 2023-03-14 DIAGNOSIS — F1010 Alcohol abuse, uncomplicated: Secondary | ICD-10-CM | POA: Diagnosis not present

## 2023-03-14 LAB — RPR
RPR Ser Ql: REACTIVE — AB
RPR Titer: 1:2 {titer}

## 2023-03-14 NOTE — ED Notes (Signed)
Pt is currently sleeping, no distress noted, environmental check complete, will continue to monitor patient for safety. ? ?

## 2023-03-14 NOTE — ED Notes (Signed)
Patient has been up and about throughout the shift. Patient has been calm and sociable with staff and the other patients on the unit. Patient is being monitored by staff for safety.

## 2023-03-14 NOTE — Discharge Planning (Signed)
LCSW was informed by patient that she would like to discharge and go to the [facility] and follow up with [facility]. Patient reports she has been scheduled to follow up with [facility] for a while but has not been going. Patient reports she "needs to this or she would be in violation of her probation". Patient reports she also has a court date that she has to be at next Thursday and reports she was told by her PO that if she remained at the [facility] she would be in violation. LCSW provided brief supportive counseling to the patient. Patient receptive and in agreement with LCSW following up with her PO to obtain more information.   LCSW contacted the patient's PO Mock [phone]. LCSW explained current situation to [employer] and he reports he has not said such thing to patient. Mock reports the patient is in a court order program at this time which places a lot of restrictions on the patient. Officer Mock reports the patient is seen 3-4 times out of the week by him and a case manager, has a 7:00pm curfew, and has to report to court every other Thursday. Officer Mock reports the patient cannot leave the county unless she has been before a judge and discharged from their program. Risk manager reports he is fine with the patient remaining at the [facility], however nearing Thursday patient would need to be discharged to make it to court. Officer Mock reports they are trying to get the patient into [facility] which is a 2 week program so that she will have somewhere to be that addresses both mental health and substance use. LCSW discussed current referrals that have been sent out for review for the patient and Officer Mock reports he will only approve for the patient to go to [facility] if accepted but she cannot go to [employer]. Officer Mock asked if LCSW could discharge the patient on Monday morning and send her to their facility and they would assist her from there. Address provided was [address] [place], [place] [address]. Officer Mock aware that plan will be discussed with medical team and patient, and update will be provided as received.   Lucius Conn, LCSW Clinical Social Worker [place] [facility] Ph: [phone]

## 2023-03-14 NOTE — ED Provider Notes (Signed)
Behavioral Health Progress Note  Date and Time: 03/14/2023 9:53 PM Name: Donna Zamora MRN:  [id number]  Subjective:  Donna Zamora, 37 y.o., female with a self-reported psychiatric history of substance abuse, schizophrenia, multiple personalities, depression and PTSD.  On evaluation, patient is alert and oriented x 4. Her thought process is linear and speech is clear and coherent. Her mood is "good, much better" today.  She has good eye contact.  She denies SI/HI/AVH. There is no objective evidence that the patient is currently responding to internal or external stimuli.  Today she denies withdrawal sx.  She reports improved sleep, appetite, and energy levels today.   Patient is becoming increasingly frustrated with her legal team but she is advised to allow time for our team to work on her behalf. This Probation officer spoke to [employer] to determine who the judge will be for her case; awaiting response.    HPI: The patient presented to [facility] on 03/10/22 as a walk-in stating that she feels she is ready to receive treatment for her substance abuse.  She reports a history of cocaine and alcohol use for the last 5 years.  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  She was last admitted to [facility] in October of 2023 but left AMA due to desire to use.  She did self-present to  [facility] last month. She stayed for 20 days before leaving AMA and relapsing.  She did not continue her psychiatric medications when she left from [facility].  She denies history of withdrawal seizures.    Diagnosis:  Final diagnoses:  Substance induced mood disorder (HCC)  Cocaine abuse ([place])  Alcohol abuse    Total Time spent with patient: 30 minutes  Past Psychiatric History: self reported history of substance abuse, schizophrenia, multiple personality disorder, PTSD. multiple inpatient psychiatric hospitalizations, with last reported inpatient psychiatric hospitalization a couple years ago.   Past Medical History: no significant medical history Family History: unknown Family Psychiatric  History: unknown Social History: Patient reports that she lives with a friend but this is a place where she gets high, no other housing options.  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  No communication with family.  Uses cocaine and alcohol.  Additional Social History:    Pain Medications: SEE MAR Prescriptions: SEE MAR Over the Counter: SEE MAR History of alcohol / drug use?: Yes Negative Consequences of Use: Personal relationships, Work / School Withdrawal Symptoms: None Name of Substance 1: COCAINE 1 - Age of First Use: 28 1 - Amount (size/oz): VAIRES 1 - Frequency: DAILY 1 - Duration: ONGOING 1 - Last Use / Amount: YESTERDAY/$200 1 - Method of Aquiring: UNKNOWN 1- Route of Use: SMOKING Name of Substance 2: ALCOHOL 2 - Age of First Use: 14 2 - Amount (size/oz): COUPLE SHOTS 2 - Frequency: 1-2 MONTH 2 - Duration: ONGOING 2 - Last Use / Amount: YESTERDAY/FIFTH 2 - Method of Aquiring: UNKNONW 2 - Route of Substance Use: ORAL                Sleep: Fair  Appetite:  Fair  Current Medications:  Current Facility-Administered Medications  Medication Dose Route Frequency Provider Last Rate Last Admin   acetaminophen (TYLENOL) tablet 650 mg  650 mg Oral Q6H PRN Revonda Humphrey, NP       alum & mag hydroxide-simeth (MAALOX/MYLANTA) 200-200-20 MG/5ML suspension 30 mL  30 mL Oral Q4H PRN Revonda Humphrey, NP  dextromethorphan-guaiFENesin (MUCINEX DM) 30-600 MG per 12 hr tablet 1 tablet  1 tablet Oral BID PRN Onuoha, Chinwendu V, NP   1 tablet at 03/11/23 2326   magnesium hydroxide (MILK OF MAGNESIA) suspension 30 mL  30 mL Oral Daily PRN Revonda Humphrey, NP       melatonin tablet 3 mg  3 mg Oral QHS PRN Onuoha, Chinwendu V, NP   3 mg at 03/14/23 2118   multivitamin with minerals tablet 1 tablet  1 tablet Oral Daily Revonda Humphrey, NP   1 tablet at  03/14/23 0957   QUEtiapine (SEROQUEL) tablet 50 mg  50 mg Oral QHS Rosezetta Schlatter, MD   50 mg at 03/14/23 2117   thiamine (VITAMIN B1) tablet 100 mg  100 mg Oral Daily Revonda Humphrey, NP   100 mg at 03/14/23 [id number]   Current Outpatient Medications  Medication Sig Dispense Refill   QUEtiapine (SEROQUEL) 50 MG tablet Take 1 tablet (50 mg total) by mouth at bedtime. (Patient not taking: Reported on 03/12/2023) 30 tablet 0    Labs  Lab Results:  Admission on 03/11/2023  Component Date Value Ref Range Status   SARS Coronavirus 2 by RT PCR 03/11/2023 NEGATIVE  NEGATIVE Final   Performed at [facility], [place] [address] [place], [place] [address]   WBC 03/11/2023 8.4  4.0 - 10.5 K/uL Final   RBC 03/11/2023 4.52  3.87 - 5.11 MIL/uL Final   Hemoglobin 03/11/2023 9.7 (L)  12.0 - 15.0 g/dL Final   HCT 03/11/2023 31.6 (L)  36.0 - 46.0 % Final   MCV 03/11/2023 69.9 (L)  80.0 - 100.0 fL Final   MCH 03/11/2023 21.5 (L)  26.0 - 34.0 pg Final   MCHC 03/11/2023 30.7  30.0 - 36.0 g/dL Final   RDW 03/11/2023 19.4 (H)  11.5 - 15.5 % Final   Platelets 03/11/2023 PLATELET CLUMPS NOTED ON SMEAR, COUNT APPEARS ADEQUATE  150 - 400 K/uL Final   nRBC 03/11/2023 0.0  0.0 - 0.2 % Final   Neutrophils Relative % 03/11/2023 65  % Final   Neutro Abs 03/11/2023 5.5  1.7 - 7.7 K/uL Final   Lymphocytes Relative 03/11/2023 24  % Final   Lymphs Abs 03/11/2023 2.0  0.7 - 4.0 K/uL Final   Monocytes Relative 03/11/2023 9  % Final   Monocytes Absolute 03/11/2023 0.7  0.1 - 1.0 K/uL Final   Eosinophils Relative 03/11/2023 1  % Final   Eosinophils Absolute 03/11/2023 0.1  0.0 - 0.5 K/uL Final   Basophils Relative 03/11/2023 1  % Final   Basophils Absolute 03/11/2023 0.1  0.0 - 0.1 K/uL Final   WBC Morphology 03/11/2023 MORPHOLOGY UNREMARKABLE   Final   RBC Morphology 03/11/2023 MORPHOLOGY UNREMARKABLE   Final   Immature Granulocytes 03/11/2023 0  % Final   Abs Immature Granulocytes 03/11/2023 0.02  0.00 - 0.07 K/uL  Final   Performed at [facility], [place] [address] [place], [place] [address]   Sodium 03/11/2023 137  135 - 145 mmol/L Final   Potassium 03/11/2023 3.9  3.5 - 5.1 mmol/L Final   Chloride 03/11/2023 107  98 - 111 mmol/L Final   CO2 03/11/2023 20 (L)  22 - 32 mmol/L Final   Glucose, Bld 03/11/2023 93  70 - 99 mg/dL Final   Glucose reference range applies only to samples taken after fasting for at least 8 hours.   BUN 03/11/2023 8  6 - 20 mg/dL Final  Creatinine, Ser 03/11/2023 0.74  0.44 - 1.00 mg/dL Final   Calcium 03/11/2023 9.0  8.9 - 10.3 mg/dL Final   Total Protein 03/11/2023 7.2  6.5 - 8.1 g/dL Final   Albumin 03/11/2023 3.5  3.5 - 5.0 g/dL Final   AST 03/11/2023 44 (H)  15 - 41 U/L Final   ALT 03/11/2023 36  0 - 44 U/L Final   Alkaline Phosphatase 03/11/2023 53  38 - 126 U/L Final   Total Bilirubin 03/11/2023 0.4  0.3 - 1.2 mg/dL Final   GFR, Estimated 03/11/2023 >60  >60 mL/min Final   Comment: (NOTE) Calculated using the CKD-EPI Creatinine Equation (2021)    Anion gap 03/11/2023 10  5 - 15 Final   Performed at [place] [address], [place], [place] [address]   Hgb A1c MFr Bld 03/11/2023 5.6  4.8 - 5.6 % Final   Comment: (NOTE)         Prediabetes: 5.7 - 6.4         Diabetes: >6.4         Glycemic control for adults with diabetes: <7.0    Mean Plasma Glucose 03/11/2023 114  mg/dL Final   Comment: (NOTE) Performed At: [facility] [place], [place] [id number] Rush Farmer MD [id number]    Magnesium 03/11/2023 1.9  1.7 - 2.4 mg/dL Final   Performed at [facility], [place] [address], [place], [place] [address]   POC Amphetamine UR 03/11/2023 None Detected  NONE DETECTED (Cut Off Level 1000 ng/mL) Final   POC Secobarbital (BAR) 03/11/2023 None Detected  NONE DETECTED (Cut Off Level 300 ng/mL) Final   POC Buprenorphine (BUP) 03/11/2023 None Detected  NONE DETECTED (Cut Off Level 10 ng/mL) Final   POC Oxazepam (BZO)  03/11/2023 None Detected  NONE DETECTED (Cut Off Level 300 ng/mL) Final   POC Cocaine UR 03/11/2023 Positive (A)  NONE DETECTED (Cut Off Level 300 ng/mL) Final   POC Methamphetamine UR 03/11/2023 None Detected  NONE DETECTED (Cut Off Level 1000 ng/mL) Final   POC Morphine 03/11/2023 None Detected  NONE DETECTED (Cut Off Level 300 ng/mL) Final   POC Methadone UR 03/11/2023 None Detected  NONE DETECTED (Cut Off Level 300 ng/mL) Final   POC Oxycodone UR 03/11/2023 None Detected  NONE DETECTED (Cut Off Level 100 ng/mL) Final   POC Marijuana UR 03/11/2023 Positive (A)  NONE DETECTED (Cut Off Level 50 ng/mL) Final   Color, Urine 03/11/2023 YELLOW  YELLOW Final   APPearance 03/11/2023 CLOUDY (A)  CLEAR Final   Specific Gravity, Urine 03/11/2023 1.025  1.005 - 1.030 Final   pH 03/11/2023 6.0  5.0 - 8.0 Final   Glucose, UA 03/11/2023 NEGATIVE  NEGATIVE mg/dL Final   Hgb urine dipstick 03/11/2023 NEGATIVE  NEGATIVE Final   Bilirubin Urine 03/11/2023 NEGATIVE  NEGATIVE Final   Ketones, ur 03/11/2023 NEGATIVE  NEGATIVE mg/dL Final   Protein, ur 03/11/2023 NEGATIVE  NEGATIVE mg/dL Final   Nitrite 03/11/2023 NEGATIVE  NEGATIVE Final   Leukocytes,Ua 03/11/2023 NEGATIVE  NEGATIVE Final   RBC / HPF 03/11/2023 0-5  0 - 5 RBC/hpf Final   WBC, UA 03/11/2023 0-5  0 - 5 WBC/hpf Final   Bacteria, UA 03/11/2023 RARE (A)  NONE SEEN Final   Squamous Epithelial / HPF 03/11/2023 21-50  0 - 5 /HPF Final   Mucus 03/11/2023 PRESENT   Final   Performed at [facility], [place] [address] [place], [place] [address]  SARSCOV2ONAVIRUS 2 AG 03/11/2023 NEGATIVE  NEGATIVE Final   Comment: (NOTE) SARS-CoV-2 antigen NOT DETECTED.   Negative results are presumptive.  Negative results do not preclude SARS-CoV-2 infection and should not be used as the sole basis for treatment or other patient management decisions, including infection  control decisions, particularly in the presence of clinical signs and  symptoms  consistent with COVID-19, or in those who have been in contact with the virus.  Negative results must be combined with clinical observations, patient history, and epidemiological information. The expected result is Negative.  Fact Sheet for Patients: HandmadeRecipes.com.cy  Fact Sheet for Healthcare Providers: FuneralLife.at  This test is not yet approved or cleared by the [place] FDA and  has been authorized for detection and/or diagnosis of SARS-CoV-2 by FDA under an Emergency Use Authorization (EUA).  This EUA will remain in effect (meaning this test can be used) for the duration of  the COV                          ID-19 declaration under Section 564(b)(1) of the Act, 21 U.S.C. section 360bbb-3(b)(1), unless the authorization is terminated or revoked sooner.     Preg Test, Ur 03/11/2023 NEGATIVE  NEGATIVE Final   Comment:        THE SENSITIVITY OF THIS METHODOLOGY IS >24 mIU/mL    HIV Screen 4th Generation wRfx 03/13/2023 Non Reactive  Non Reactive Final   Performed at [facility], [place] [address], [place], [place] [address]   RPR Ser Ql 03/13/2023 Reactive (A)  NON REACTIVE Final   SENT FOR CONFIRMATION   RPR Titer 03/13/2023 1:2   Final   Performed at [facility], [place] [address] [place], [place] [address]  Office Visit on 02/18/2023  Component Date Value Ref Range Status   HCV Ab 02/18/2023 Reactive (A)  Non Reactive Final   Hepatitis C Quantitation 02/18/2023 20  IU/mL Final   HCV log10 02/18/2023 1.301  log10 IU/mL Final   Test Information (HCV): 02/18/2023 Comment   Final   The quantitative range of this assay is 15 IU/mL to 100 million IU/mL.   Interpretation (HCV): 02/18/2023 Comment   Final   Comment: Positive HCV antibody screen with the presence of HCV RNA is consistent with active infection.   Admission on 10/05/2022, Discharged on 10/08/2022  Component Date Value Ref Range Status   TSH  10/06/2022 0.804  0.350 - 4.500 uIU/mL Final   Comment: Performed by a 3rd Generation assay with a functional sensitivity of <=0.01 uIU/mL. Performed at [facility], [place] [address], [place], [place] [address]    Neisseria Gonorrhea 10/06/2022 Negative   Final   Chlamydia 10/06/2022 Negative   Final   Comment 10/06/2022 Normal Reference [place] Chlamydia - Negative   Final   Comment 10/06/2022 Normal Reference Range Neisseria Gonorrhea - Negative   Final   RPR Ser Ql 10/06/2022 Reactive (A)  NON REACTIVE Final   SENT FOR CONFIRMATION   RPR Titer 10/06/2022 1:2   Final   Performed at [facility], [place] [address] [place], [place] [address]   HIV Screen 4th Generation wRfx 10/06/2022 Non Reactive  Non Reactive Final   Performed at [facility], [place] [address] [place], [place] [address]   Cholesterol 10/06/2022 160  0 - 200 mg/dL Final   Triglycerides 10/06/2022 78  <150 mg/dL Final   HDL 10/06/2022 50  >40  mg/dL Final   Total CHOL/HDL Ratio 10/06/2022 3.2  RATIO Final   VLDL 10/06/2022 16  0 - 40 mg/dL Final   LDL Cholesterol 10/06/2022 94  0 - 99 mg/dL Final   Comment:        Total Cholesterol/HDL:CHD Risk Coronary Heart Disease Risk Table                     Men   Women  1/2 Average Risk   3.4   3.3  Average Risk       5.0   4.4  2 X Average Risk   9.6   7.1  3 X Average Risk  23.4   11.0        Use the calculated Patient Ratio above and the CHD Risk Table to determine the patient's CHD Risk.        ATP III CLASSIFICATION (LDL):  <100     mg/dL   Optimal  100-129  mg/dL   Near or Above                    Optimal  130-159  mg/dL   Borderline  160-189  mg/dL   High  >190     mg/dL   Very High Performed at [place] [address] [place], [place] [address]    T Pallidum Abs 10/06/2022 Reactive (A)  Non Reactive Final   Comment: (NOTE) Performed At: [facility] [place], [place] [id number] Rush Farmer MD [id number]    Admission on 10/04/2022, Discharged on 10/05/2022  Component Date Value Ref Range Status   SARS Coronavirus 2 by RT PCR 10/04/2022 NEGATIVE  NEGATIVE Final   Comment: (NOTE) SARS-CoV-2 target nucleic acids are NOT DETECTED.  The SARS-CoV-2 RNA is generally detectable in upper respiratory specimens during the acute phase of infection. The lowest concentration of SARS-CoV-2 viral copies this assay can detect is 138 copies/mL. A negative result does not preclude SARS-Cov-2 infection and should not be used as the sole basis for treatment or other patient management decisions. A negative result may occur with  improper specimen collection/handling, submission of specimen other than nasopharyngeal swab, presence of viral mutation(s) within the areas targeted by this assay, and inadequate number of viral copies(<138 copies/mL). A negative result must be combined with clinical observations, patient history, and epidemiological information. The expected result is Negative.  Fact Sheet for Patients:  EntrepreneurPulse.com.au  Fact Sheet for Healthcare Providers:  IncredibleEmployment.be  This test is no                          t yet approved or cleared by the [place] FDA and  has been authorized for detection and/or diagnosis of SARS-CoV-2 by FDA under an Emergency Use Authorization (EUA). This EUA will remain  in effect (meaning this test can be used) for the duration of the COVID-19 declaration under Section 564(b)(1) of the Act, 21 U.S.C.section 360bbb-3(b)(1), unless the authorization is terminated  or revoked sooner.       Influenza A by PCR 10/04/2022 NEGATIVE  NEGATIVE Final   Influenza B by PCR 10/04/2022 NEGATIVE  NEGATIVE Final   Comment: (NOTE) The Xpert Xpress SARS-CoV-2/FLU/RSV plus assay is intended as an aid in the diagnosis of influenza from Nasopharyngeal swab specimens and should not be used as a sole basis for treatment.  Nasal washings and aspirates are unacceptable for Xpert Xpress SARS-CoV-2/FLU/RSV testing.  Fact Sheet for Patients: EntrepreneurPulse.com.au  Fact Sheet for Healthcare Providers: IncredibleEmployment.be  This test is not yet approved or cleared by the [place] FDA and has been authorized for detection and/or diagnosis of SARS-CoV-2 by FDA under an Emergency Use Authorization (EUA). This EUA will remain in effect (meaning this test can be used) for the duration of the COVID-19 declaration under Section 564(b)(1) of the Act, 21 U.S.C. section 360bbb-3(b)(1), unless the authorization is terminated or revoked.  Performed at [facility], [place] [address], [place], [place] [address]    Sodium 10/04/2022 141  135 - 145 mmol/L Final   Potassium 10/04/2022 3.4 (L)  3.5 - 5.1 mmol/L Final   Chloride 10/04/2022 112 (H)  98 - 111 mmol/L Final   CO2 10/04/2022 22  22 - 32 mmol/L Final   Glucose, Bld 10/04/2022 107 (H)  70 - 99 mg/dL Final   Glucose reference range applies only to samples taken after fasting for at least 8 hours.   BUN 10/04/2022 11  6 - 20 mg/dL Final   Creatinine, Ser 10/04/2022 0.69  0.44 - 1.00 mg/dL Final   Calcium 10/04/2022 8.9  8.9 - 10.3 mg/dL Final   Total Protein 10/04/2022 7.4  6.5 - 8.1 g/dL Final   Albumin 10/04/2022 3.4 (L)  3.5 - 5.0 g/dL Final   AST 10/04/2022 33  15 - 41 U/L Final   ALT 10/04/2022 34  0 - 44 U/L Final   Alkaline Phosphatase 10/04/2022 59  38 - 126 U/L Final   Total Bilirubin 10/04/2022 0.5  0.3 - 1.2 mg/dL Final   GFR, Estimated 10/04/2022 >60  >60 mL/min Final   Comment: (NOTE) Calculated using the CKD-EPI Creatinine Equation (2021)    Anion gap 10/04/2022 7  5 - 15 Final   Performed at [place] [address], [place], [place] [address]   Alcohol, Ethyl (B) 10/04/2022 <10  <10 mg/dL Final   Comment: (NOTE) Lowest detectable limit for serum alcohol is 10 mg/dL.  For medical  purposes only. Performed at [facility], [place] [address], [place], [place] [address]    Opiates 10/04/2022 NONE DETECTED  NONE DETECTED Final   Cocaine 10/04/2022 POSITIVE (A)  NONE DETECTED Final   Benzodiazepines 10/04/2022 NONE DETECTED  NONE DETECTED Final   Amphetamines 10/04/2022 POSITIVE (A)  NONE DETECTED Final   Tetrahydrocannabinol 10/04/2022 POSITIVE (A)  NONE DETECTED Final   Barbiturates 10/04/2022 NONE DETECTED  NONE DETECTED Final   Comment: (NOTE) DRUG SCREEN FOR MEDICAL PURPOSES ONLY.  IF CONFIRMATION IS NEEDED FOR ANY PURPOSE, NOTIFY LAB WITHIN 5 DAYS.  LOWEST DETECTABLE LIMITS FOR URINE DRUG SCREEN Drug Class                     Cutoff (ng/mL) Amphetamine and metabolites    1000 Barbiturate and metabolites    200 Benzodiazepine                 200 Opiates and metabolites        300 Cocaine and metabolites        300 THC                            50 Performed at [facility], [place] [address] [place], [place] [address]    WBC 10/04/2022 11.5 (H)  4.0 - 10.5 K/uL Final   RBC 10/04/2022 3.91  3.87 - 5.11  MIL/uL Final   Hemoglobin 10/04/2022 8.1 (L)  12.0 - 15.0 g/dL Final   Comment: Reticulocyte Hemoglobin testing may be clinically indicated, consider ordering this additional test [id number]    HCT 10/04/2022 27.6 (L)  36.0 - 46.0 % Final   MCV 10/04/2022 70.6 (L)  80.0 - 100.0 fL Final   MCH 10/04/2022 20.7 (L)  26.0 - 34.0 pg Final   MCHC 10/04/2022 29.3 (L)  30.0 - 36.0 g/dL Final   RDW 10/04/2022 21.2 (H)  11.5 - 15.5 % Final   Platelets 10/04/2022 521 (H)  150 - 400 K/uL Final   nRBC 10/04/2022 0.0  0.0 - 0.2 % Final   Neutrophils Relative % 10/04/2022 72  % Final   Neutro Abs 10/04/2022 8.3 (H)  1.7 - 7.7 K/uL Final   Lymphocytes Relative 10/04/2022 21  % Final   Lymphs Abs 10/04/2022 2.5  0.7 - 4.0 K/uL Final   Monocytes Relative 10/04/2022 5  % Final   Monocytes Absolute 10/04/2022 0.6  0.1 - 1.0 K/uL Final   Eosinophils Relative  10/04/2022 1  % Final   Eosinophils Absolute 10/04/2022 0.1  0.0 - 0.5 K/uL Final   Basophils Relative 10/04/2022 0  % Final   Basophils Absolute 10/04/2022 0.0  0.0 - 0.1 K/uL Final   Immature Granulocytes 10/04/2022 1  % Final   Abs Immature Granulocytes 10/04/2022 0.06  0.00 - 0.07 K/uL Final   Performed at [facility], [place] [address] [place], [place] [address]   I-stat hCG, quantitative 10/04/2022 <5.0  <5 mIU/mL Final   Comment 3 10/04/2022          Final   Comment:   GEST. AGE      CONC.  (mIU/mL)   <=1 WEEK        5 - 50     2 WEEKS       50 - 500     3 WEEKS       100 - 10,000     4 WEEKS     1,000 - 30,000        FEMALE AND NON-PREGNANT FEMALE:     LESS THAN 5 mIU/mL    Salicylate Lvl 0000000 <7.0 (L)  7.0 - 30.0 mg/dL Final   Performed at [facility], [place] [address], [place], [place] [address]   Acetaminophen (Tylenol), Serum 10/04/2022 <10 (L)  10 - 30 ug/mL Final   Comment: (NOTE) Therapeutic concentrations vary significantly. A range of 10-30 ug/mL  may be an effective concentration for many patients. However, some  are best treated at concentrations outside of this range. Acetaminophen concentrations >150 ug/mL at 4 hours after ingestion  and >50 ug/mL at 12 hours after ingestion are often associated with  toxic reactions.  Performed at [facility], [place] [address] [place], [place] [address]    Color, Urine 10/04/2022 YELLOW  YELLOW Final   APPearance 10/04/2022 HAZY (A)  CLEAR Final   Specific Gravity, Urine 10/04/2022 1.028  1.005 - 1.030 Final   pH 10/04/2022 6.0  5.0 - 8.0 Final   Glucose, UA 10/04/2022 NEGATIVE  NEGATIVE mg/dL Final   Hgb urine dipstick 10/04/2022 NEGATIVE  NEGATIVE Final   Bilirubin Urine 10/04/2022 NEGATIVE  NEGATIVE Final   Ketones, ur 10/04/2022 NEGATIVE  NEGATIVE mg/dL Final   Protein, ur 10/04/2022 30 (A)  NEGATIVE mg/dL Final   Nitrite 10/04/2022 NEGATIVE  NEGATIVE Final   Leukocytes,Ua 10/04/2022 NEGATIVE   NEGATIVE Final  RBC / HPF 10/04/2022 0-5  0 - 5 RBC/hpf Final   WBC, UA 10/04/2022 0-5  0 - 5 WBC/hpf Final   Bacteria, UA 10/04/2022 NONE SEEN  NONE SEEN Final   Squamous Epithelial / HPF 10/04/2022 6-10  0 - 5 Final   Mucus 10/04/2022 PRESENT   Final   Performed at [facility], [place] [address] [place],  [address]    Blood Alcohol level:  Lab Results  Component Value Date   ETH <10 0000000    Metabolic Disorder Labs: Lab Results  Component Value Date   HGBA1C 5.6 03/11/2023   MPG 114 03/11/2023   No results found for: "PROLACTIN" Lab Results  Component Value Date   CHOL 160 10/06/2022   TRIG 78 10/06/2022   HDL 50 10/06/2022   CHOLHDL 3.2 10/06/2022   VLDL 16 10/06/2022   LDLCALC 94 10/06/2022    Therapeutic Lab Levels: No results found for: "LITHIUM" No results found for: "VALPROATE" No results found for: "CBMZ"  Physical Findings   PHQ2-9    [place] ED from 03/11/2023 in [facility] ED from 10/05/2022 in [facility]  PHQ-2 Total Score 6 2  PHQ-9 Total Score 20 8      [place] ED from 03/11/2023 in [facility] Most recent reading at 03/11/2023  6:30 PM ED from 10/04/2022 in [facility] Emergency Department at [facility] Most recent reading at 10/04/2022  8:24 PM ED from 10/04/2022 in [facility] Emergency Department at [facility] Most recent reading at 10/04/2022  5:55 PM  C-SSRS RISK CATEGORY No Risk High Risk No Risk        Musculoskeletal  Strength & Muscle Tone: within normal limits Gait & Station: normal Patient leans: N/A  Psychiatric Specialty Exam  Presentation  General Appearance:  Tired and restless  Eye Contact: Good  Speech: Clear and Coherent; Normal Rate  Speech Volume: Normal  Handedness: Right   Mood and Affect  Mood: Euthymic  Affect: Congruent   Thought Process  Thought  Processes: Coherent; Goal Directed; Linear  Descriptions of Associations:Intact  Orientation:Full (Time, Place and Person)  Thought Content:WDL; Logical  Diagnosis of Schizophrenia or Schizoaffective disorder in past: Yes    Hallucinations:Hallucinations: None   Ideas of Reference:None  Suicidal Thoughts:Suicidal Thoughts: No   Homicidal Thoughts:Homicidal Thoughts: No    Sensorium  Memory: Immediate Good; Recent Good  Judgment: Fair  Insight: Fair   Materials engineer: Fair  Attention Span: Fair  Recall: [employer] of Knowledge: Fair  Language: Fair   Psychomotor Activity  Psychomotor Activity: Psychomotor Activity: Normal    Assets  Assets: Armed forces logistics/support/administrative officer; Desire for Improvement; Housing; Leisure Time; Physical Health; Social Support; Vocational/Educational   Sleep  Sleep: Fair    No data recorded   Physical Exam  Physical Exam Vitals reviewed.  Constitutional:      General: She is not in acute distress.    Appearance: She is not toxic-appearing.  HENT:     Head: Normocephalic.  Pulmonary:     Effort: Pulmonary effort is normal. No respiratory distress.  Neurological:     Mental Status: She is oriented to person, place, and time.     Motor: No weakness.     Gait: Gait normal.    Review of Systems  Constitutional:  Negative for malaise/fatigue.  Respiratory:  Negative for shortness of breath.   Cardiovascular:  Negative for chest  pain.  Gastrointestinal:  Negative for abdominal pain, constipation, diarrhea, nausea and vomiting.  Neurological:  Positive for tremors and headaches. Negative for dizziness.   Blood pressure 116/76, pulse 70, temperature 98.9 F (37.2 C), temperature source Oral, resp. rate 16, last menstrual period 01/21/2023, SpO2 100 %, unknown if currently breastfeeding. There is no height or weight on file to calculate BMI.  Treatment Plan Summary: Daily contact with patient to  assess and evaluate symptoms and progress in treatment and Medication management  AUD - CIWA protocol for monitoring of withdrawal with po thiamine and MVI replacement and Ativan 1mg  for scores >10   H/o Schizophrenia Depressive episode - Continue home Seroquel 50 mg qHS  Dispo: Pending  Rosezetta Schlatter, MD 03/14/2023, 9:53 PM

## 2023-03-14 NOTE — ED Notes (Signed)
Patient has spent most of the day socializing and watching television in the dayroom. Patient has been pleasant to staff and other patients. Patient is being monitored for safety.

## 2023-03-14 NOTE — ED Notes (Signed)
Patient is currently attending AA

## 2023-03-14 NOTE — ED Notes (Signed)
Pt sleeping in no acute distress. RR even and unlabored. Environment secured. Will continue to monitor for safety. 

## 2023-03-14 NOTE — ED Notes (Signed)
Patient A&Ox4. Denies intent to harm self/others when asked. Denies A/VH. Patient denies any physical complaints when asked. No acute distress noted. Pt request to discharge today. Pt states, "I'm going to [facility]. I want to leave as soon as possible". Support and encouragement provided to stay and complete tx regimen. Pt refuse. MD notified. Routine safety checks conducted according to facility protocol. Encouraged patient to notify staff if thoughts of harm toward self or others arise. Patient verbalize understanding and agreement. Will continue to monitor for safety.

## 2023-03-15 DIAGNOSIS — Z1152 Encounter for screening for COVID-19: Secondary | ICD-10-CM | POA: Diagnosis not present

## 2023-03-15 DIAGNOSIS — F1994 Other psychoactive substance use, unspecified with psychoactive substance-induced mood disorder: Secondary | ICD-10-CM | POA: Diagnosis not present

## 2023-03-15 DIAGNOSIS — F1414 Cocaine abuse with cocaine-induced mood disorder: Secondary | ICD-10-CM | POA: Diagnosis not present

## 2023-03-15 DIAGNOSIS — F1010 Alcohol abuse, uncomplicated: Secondary | ICD-10-CM | POA: Diagnosis not present

## 2023-03-15 LAB — T.PALLIDUM AB, TOTAL: T Pallidum Abs: REACTIVE — AB

## 2023-03-15 MED ORDER — DOXYCYCLINE HYCLATE 100 MG PO TABS
100.0000 mg | ORAL_TABLET | Freq: Two times a day (BID) | ORAL | Status: DC
  Administered 2023-03-15 – 2023-03-16 (×2): 100 mg via ORAL
  Filled 2023-03-15 (×2): qty 1

## 2023-03-15 NOTE — ED Notes (Signed)
Pt observed/assessed in room sleeping. RR even and unlabored, appearing in no noted distress. Environmental check complete, will continue to monitor for safety

## 2023-03-15 NOTE — ED Notes (Addendum)
Pt sleeping in no acute distress. RR even and unlabored. Environment secured. Will continue to monitor for safety. 

## 2023-03-15 NOTE — ED Notes (Signed)
Patient is sleeping in bed with no sxs of distress - will continue to monitor for safety

## 2023-03-15 NOTE — ED Notes (Addendum)
Patient A&Ox4. Denies intent to harm self/others when asked. Denies A/VH. Patient denies any physical complaints when asked. No acute distress noted. Support and encouragement provided. Routine safety checks conducted according to facility protocol. Encouraged patient to notify staff if thoughts of harm toward self or others arise. Patient verbalize understanding and agreement. Will continue to monitor for safety.    

## 2023-03-15 NOTE — Discharge Planning (Signed)
LCSW attempted to get in contact with the patient's PO Mock [phone], however received no answer. Call went directly to voicemail. LCSW attempted to contact Drug Court Counselor Lucita Lora [phone] to touch basis regarding disposition plan, however received no answer. LCSW left voice message requesting phone call back. LCSW will have discussion with medical team regarding plans for patient.   LCSW contacted [facility] and spoke with Admissions Coordinator Elmo Putt. Per Elmo Putt, phone screening was complete with the patient on 03/27 and patient was approved for services. LCSW was referred over to [facility] in scheduling regarding admission date. Per Marcia Brash, patient can admit to the program on Tuesday 03/19/23 at 10:30am. Patient will need clothes and 21 day supply of medication. Marcia Brash was informed that agency could not provide 31 day supply of meds, however could provide 30 day script for meds. LCSW was transferred over to Admissions RN [place] ext [phone] to speak regarding meds. Per Crystal, patient will need at least a 21 day supply of meds to be admitted. Crystal aware that LCSW will have conversation with MD regarding need and then follow up at earliest convenience. Crystal expressed understanding. No other needs were reported at this time. LCSW to provide an update to patient, MD, and court team regarding plan.   Lucius Conn, LCSW Clinical Social Worker [facility] Ph: [phone]

## 2023-03-15 NOTE — ED Notes (Signed)
Pt sitting in dayroom interacting with peers. No acute distress noted. No concerns voiced. Informed pt to notify staff with any needs or assistance. Pt verbalized understanding or agreement. Will continue to monitor for safety. 

## 2023-03-15 NOTE — ED Notes (Signed)
Patient attended the [facility] meeting the entire meeting duration.

## 2023-03-15 NOTE — Discharge Instructions (Signed)
[facility]: Admissions Coordinator Donna Zamora [id number] Location of facility is [place], [place], [place], [address] [phone] phone  New Patient Assessment/Therapy Walk-Ins:  Monday and Wednesday: 8 am until slots are full. Every 1st and 2nd Fridays of the month: 1 pm - 5 pm.  NO ASSESSMENT/THERAPY WALK-INS ON [place]  New Patient Assessment/Medication Management Walk-Ins:  Monday - Friday:  8 am - 11 am.  For all walk-ins, we ask that you arrive by 7:30 am because patients will be seen in the order of arrival.  Availability is limited; therefore, you may not be seen on the same day that you walk-in.  Our goal is to serve and meet the needs of our community to the best of our ability.  SUBSTANCE USE TREATMENT for Medicaid and State Funded/IPRS  [facility] [place], [place], [address] [phone] phone NOTE: [facility] is no longer offering IOP services.  Serves those who are low-income or have no insurance.  [facility] [address] [place], [place], [address] [phone] phone [phone] fax NOTE: Does have Substance Abuse-Intensive Outpatient Program [facility]) as well as transitional housing if eligible.  [place], [place], [address] [phone] phone [phone] fax  [place] [id number] [place], [place], [address] [phone] phone [phone] fax  HALFWAY HOUSES:  [facility] [phone]  [employer].oxfordvacancies.com  12 STEP PROGRAMS:  Alcoholics Anonymous of [place] ReportZoo.com.cy  Narcotics Anonymous of [place] GreenScrapbooking.dk  Al-Anon of [employer], [place] www.[place]alanon.org/find-meetings.html  Nar-Anon https://nar-anon.org/find-a-meetin  List of Residential placements:   [facility]  [facility]idential Treatment: [phone]  Benita Stabile, [place]: Female and female facility; 30-day program: (uninsured and Medicaid such as Deloria Lair, [place], [place], partners)  [place]: [phone]; men and women's facility; 28 days; Can have Medicaid tailored plan Publishing rights manager or Partners)  [place]: [phone] Janace Hoard or Jeani Hawking; 28 day program; must be fully detox; tailored Medicaid or no insurance  [facility] in [place], [place]; [phone]; 28 day all males program; no insurance accepted  [facility] in [place]: Wille Glaser [phone] (no insurance or Medicaid only); 90 days; outpatient services but provide housing in apartments downtown [place] Admission: [phone]: Patient must complete phone screening for placement: [place], [place]; 6 month program; uninsured, Medicaid, and [employer].   [facility]: no insurance required; [place]: [phone]; Intake: Herbie Baltimore; Must fill out application online; Christy Sartorius Delay [phone] x [place] in [place], [place]: [phone]; Admissions Coordinators Mr. Simona Huh or Jene Every; 90 day program.  [facility]: [place], [place] [phone]; Co-Ed 9 month to a year program; Online application; Men entry fee is $500 (6-32months);  [employer]: [address] [place], [place] [address]; no fee or insurance required; minimum of 2 years; Highly structured; work based; Intake Coordinator is Gerald Stabs [phone]  [facility] in [place], [place]: [phone]; Fax number is [phone]; website: www.[facility].org; Requires 3-6 page autobiography; 2 year program (18 months and then 76month transitional housing); Admission fee is $300; no insurance needed; work Facilities manager in [place], [place]: [place] Staff: Michel Bickers [phone]: They have a Men's Regenerations Program 6-64months. Free program;  There is an initial $300 fee however, they are willing to work with patients regarding that. Application is online.  [facility]: Admissions [place] ext [phone];  Any 7-90 day program is out of pocket; 12 month program is free of charge; there is a $275 entry fee; Patient is responsible for own transportation

## 2023-03-15 NOTE — ED Provider Notes (Cosign Needed Addendum)
Behavioral Health Progress Note  Date and Time: 03/15/2023 9:03 AM Name: Donna Zamora MRN:  [id number]  Subjective:  Donna Zamora, 37 y.o., Donna Zamora with a self-reported psychiatric history of substance abuse, schizophrenia, multiple personalities, depression and PTSD.  On evaluation, patient is alert and oriented x 4. Her thought process is linear and speech is clear and coherent. Her mood is "good" today.  She is given encouragement about staying the course and not allowing her emotions to get the best of her while our team works on the legal aspect, and she is Patent attorney. She has good eye contact.  She denies SI/HI/AVH. There is no objective evidence that the patient is currently responding to internal or external stimuli.  Today she denies withdrawal sx.  She reports improved sleep, appetite, and energy levels today.    HPI: The patient presented to [facility] on 03/10/22 as a walk-in stating that she feels she is ready to receive treatment for her substance abuse.  She reports a history of cocaine and alcohol use for the last 5 years.  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  She was last admitted to [facility] in October of 2023 but left AMA due to desire to use.  She did self-present to  [facility] last month. She stayed for 20 days before leaving AMA and relapsing.  She did not continue her psychiatric medications when she left from [facility].  She denies history of withdrawal seizures.    Diagnosis:  Final diagnoses:  Substance induced mood disorder (HCC)  Cocaine abuse ([place])  Alcohol abuse    Total Time spent with patient: 30 minutes  Past Psychiatric History: self reported history of substance abuse, schizophrenia, multiple personality disorder, PTSD. multiple inpatient psychiatric hospitalizations, with last reported inpatient psychiatric hospitalization a couple years ago.  Past Medical History: no significant medical history Family History: unknown Family  Psychiatric  History: unknown Social History: Patient reports that she lives with a friend but this is a place where she gets high, no other housing options.  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  No communication with family.  Uses cocaine and alcohol.  Additional Social History:    Pain Medications: SEE MAR Prescriptions: SEE MAR Over the Counter: SEE MAR History of alcohol / drug use?: Yes Negative Consequences of Use: Personal relationships, Work / School Withdrawal Symptoms: None Name of Substance 1: COCAINE 1 - Age of First Use: 28 1 - Amount (size/oz): VAIRES 1 - Frequency: DAILY 1 - Duration: ONGOING 1 - Last Use / Amount: YESTERDAY/$200 1 - Method of Aquiring: UNKNOWN 1- Route of Use: SMOKING Name of Substance 2: ALCOHOL 2 - Age of First Use: 14 2 - Amount (size/oz): COUPLE SHOTS 2 - Frequency: 1-2 MONTH 2 - Duration: ONGOING 2 - Last Use / Amount: YESTERDAY/FIFTH 2 - Method of Aquiring: UNKNONW 2 - Route of Substance Use: ORAL                Sleep: Fair  Appetite:  Fair  Current Medications:  Current Facility-Administered Medications  Medication Dose Route Frequency Provider Last Rate Last Admin   acetaminophen (TYLENOL) tablet 650 mg  650 mg Oral Q6H PRN Revonda Humphrey, NP       alum & mag hydroxide-simeth (MAALOX/MYLANTA) 200-200-20 MG/5ML suspension 30 mL  30 mL Oral Q4H PRN Revonda Humphrey, NP       dextromethorphan-guaiFENesin (MUCINEX DM) 30-600 MG per 12 hr tablet 1 tablet  1  tablet Oral BID PRN Onuoha, Chinwendu V, NP   1 tablet at 03/11/23 2326   magnesium hydroxide (MILK OF MAGNESIA) suspension 30 mL  30 mL Oral Daily PRN Revonda Humphrey, NP       melatonin tablet 3 mg  3 mg Oral QHS PRN Onuoha, Chinwendu V, NP   3 mg at 03/14/23 2118   multivitamin with minerals tablet 1 tablet  1 tablet Oral Daily Revonda Humphrey, NP   1 tablet at 03/14/23 0957   QUEtiapine (SEROQUEL) tablet 50 mg  50 mg Oral QHS Rosezetta Schlatter, MD    50 mg at 03/14/23 2117   thiamine (VITAMIN B1) tablet 100 mg  100 mg Oral Daily Revonda Humphrey, NP   100 mg at 03/14/23 [id number]   Current Outpatient Medications  Medication Sig Dispense Refill   QUEtiapine (SEROQUEL) 50 MG tablet Take 1 tablet (50 mg total) by mouth at bedtime. (Patient not taking: Reported on 03/12/2023) 30 tablet 0    Labs  Lab Results:  Admission on 03/11/2023  Component Date Value Ref Range Status   SARS Coronavirus 2 by RT PCR 03/11/2023 NEGATIVE  NEGATIVE Final   Performed at [facility], [place] [address] [place], [place] [address]   WBC 03/11/2023 8.4  4.0 - 10.5 K/uL Final   RBC 03/11/2023 4.52  3.87 - 5.11 MIL/uL Final   Hemoglobin 03/11/2023 9.7 (L)  12.0 - 15.0 g/dL Final   HCT 03/11/2023 31.6 (L)  36.0 - 46.0 % Final   MCV 03/11/2023 69.9 (L)  80.0 - 100.0 fL Final   MCH 03/11/2023 21.5 (L)  26.0 - 34.0 pg Final   MCHC 03/11/2023 30.7  30.0 - 36.0 g/dL Final   RDW 03/11/2023 19.4 (H)  11.5 - 15.5 % Final   Platelets 03/11/2023 PLATELET CLUMPS NOTED ON SMEAR, COUNT APPEARS ADEQUATE  150 - 400 K/uL Final   nRBC 03/11/2023 0.0  0.0 - 0.2 % Final   Neutrophils Relative % 03/11/2023 65  % Final   Neutro Abs 03/11/2023 5.5  1.7 - 7.7 K/uL Final   Lymphocytes Relative 03/11/2023 24  % Final   Lymphs Abs 03/11/2023 2.0  0.7 - 4.0 K/uL Final   Monocytes Relative 03/11/2023 9  % Final   Monocytes Absolute 03/11/2023 0.7  0.1 - 1.0 K/uL Final   Eosinophils Relative 03/11/2023 1  % Final   Eosinophils Absolute 03/11/2023 0.1  0.0 - 0.5 K/uL Final   Basophils Relative 03/11/2023 1  % Final   Basophils Absolute 03/11/2023 0.1  0.0 - 0.1 K/uL Final   WBC Morphology 03/11/2023 MORPHOLOGY UNREMARKABLE   Final   RBC Morphology 03/11/2023 MORPHOLOGY UNREMARKABLE   Final   Immature Granulocytes 03/11/2023 0  % Final   Abs Immature Granulocytes 03/11/2023 0.02  0.00 - 0.07 K/uL Final   Performed at [facility], [place] [address] [place], [place] [address]    Sodium 03/11/2023 137  135 - 145 mmol/L Final   Potassium 03/11/2023 3.9  3.5 - 5.1 mmol/L Final   Chloride 03/11/2023 107  98 - 111 mmol/L Final   CO2 03/11/2023 20 (L)  22 - 32 mmol/L Final   Glucose, Bld 03/11/2023 93  70 - 99 mg/dL Final   Glucose reference range applies only to samples taken after fasting for at least 8 hours.   BUN 03/11/2023 8  6 - 20 mg/dL Final   Creatinine, Ser 03/11/2023 0.74  0.44 - 1.00 mg/dL Final  Calcium 03/11/2023 9.0  8.9 - 10.3 mg/dL Final   Total Protein 03/11/2023 7.2  6.5 - 8.1 g/dL Final   Albumin 03/11/2023 3.5  3.5 - 5.0 g/dL Final   AST 03/11/2023 44 (H)  15 - 41 U/L Final   ALT 03/11/2023 36  0 - 44 U/L Final   Alkaline Phosphatase 03/11/2023 53  38 - 126 U/L Final   Total Bilirubin 03/11/2023 0.4  0.3 - 1.2 mg/dL Final   GFR, Estimated 03/11/2023 >60  >60 mL/min Final   Comment: (NOTE) Calculated using the CKD-EPI Creatinine Equation (2021)    Anion gap 03/11/2023 10  5 - 15 Final   Performed at [facility], [place] [address], [place], [place] [address]   Hgb A1c MFr Bld 03/11/2023 5.6  4.8 - 5.6 % Final   Comment: (NOTE)         Prediabetes: 5.7 - 6.4         Diabetes: >6.4         Glycemic control for adults with diabetes: <7.0    Mean Plasma Glucose 03/11/2023 114  mg/dL Final   Comment: (NOTE) Performed At: [facility] [place], [place] [id number] Rush Farmer MD [id number]    Magnesium 03/11/2023 1.9  1.7 - 2.4 mg/dL Final   Performed at [facility], [place] [address] [place], [place] [address]   POC Amphetamine UR 03/11/2023 None Detected  NONE DETECTED (Cut Off Level 1000 ng/mL) Final   POC Secobarbital (BAR) 03/11/2023 None Detected  NONE DETECTED (Cut Off Level 300 ng/mL) Final   POC Buprenorphine (BUP) 03/11/2023 None Detected  NONE DETECTED (Cut Off Level 10 ng/mL) Final   POC Oxazepam (BZO) 03/11/2023 None Detected  NONE DETECTED (Cut Off Level 300 ng/mL) Final   POC Cocaine UR  03/11/2023 Positive (A)  NONE DETECTED (Cut Off Level 300 ng/mL) Final   POC Methamphetamine UR 03/11/2023 None Detected  NONE DETECTED (Cut Off Level 1000 ng/mL) Final   POC Morphine 03/11/2023 None Detected  NONE DETECTED (Cut Off Level 300 ng/mL) Final   POC Methadone UR 03/11/2023 None Detected  NONE DETECTED (Cut Off Level 300 ng/mL) Final   POC Oxycodone UR 03/11/2023 None Detected  NONE DETECTED (Cut Off Level 100 ng/mL) Final   POC Marijuana UR 03/11/2023 Positive (A)  NONE DETECTED (Cut Off Level 50 ng/mL) Final   Color, Urine 03/11/2023 YELLOW  YELLOW Final   APPearance 03/11/2023 CLOUDY (A)  CLEAR Final   Specific Gravity, Urine 03/11/2023 1.025  1.005 - 1.030 Final   pH 03/11/2023 6.0  5.0 - 8.0 Final   Glucose, UA 03/11/2023 NEGATIVE  NEGATIVE mg/dL Final   Hgb urine dipstick 03/11/2023 NEGATIVE  NEGATIVE Final   Bilirubin Urine 03/11/2023 NEGATIVE  NEGATIVE Final   Ketones, ur 03/11/2023 NEGATIVE  NEGATIVE mg/dL Final   Protein, ur 03/11/2023 NEGATIVE  NEGATIVE mg/dL Final   Nitrite 03/11/2023 NEGATIVE  NEGATIVE Final   Leukocytes,Ua 03/11/2023 NEGATIVE  NEGATIVE Final   RBC / HPF 03/11/2023 0-5  0 - 5 RBC/hpf Final   WBC, UA 03/11/2023 0-5  0 - 5 WBC/hpf Final   Bacteria, UA 03/11/2023 RARE (A)  NONE SEEN Final   Squamous Epithelial / HPF 03/11/2023 21-50  0 - 5 /HPF Final   Mucus 03/11/2023 PRESENT   Final   Performed at [facility], [place] [address], [place], [place] [address]   SARSCOV2ONAVIRUS 2 AG 03/11/2023 NEGATIVE  NEGATIVE Final   Comment: (  NOTE) SARS-CoV-2 antigen NOT DETECTED.   Negative results are presumptive.  Negative results do not preclude SARS-CoV-2 infection and should not be used as the sole basis for treatment or other patient management decisions, including infection  control decisions, particularly in the presence of clinical signs and  symptoms consistent with COVID-19, or in those who have been in contact with the virus.  Negative results  must be combined with clinical observations, patient history, and epidemiological information. The expected result is Negative.  Fact Sheet for Patients: HandmadeRecipes.com.cy  Fact Sheet for Healthcare Providers: FuneralLife.at  This test is not yet approved or cleared by the [place] FDA and  has been authorized for detection and/or diagnosis of SARS-CoV-2 by FDA under an Emergency Use Authorization (EUA).  This EUA will remain in effect (meaning this test can be used) for the duration of  the COV                          ID-19 declaration under Section 564(b)(1) of the Act, 21 U.S.C. section 360bbb-3(b)(1), unless the authorization is terminated or revoked sooner.     Preg Test, Ur 03/11/2023 NEGATIVE  NEGATIVE Final   Comment:        THE SENSITIVITY OF THIS METHODOLOGY IS >24 mIU/mL    HIV Screen 4th Generation wRfx 03/13/2023 Non Reactive  Non Reactive Final   Performed at [facility], [place] [address] [place], [place] [address]   RPR Ser Ql 03/13/2023 Reactive (A)  NON REACTIVE Final   SENT FOR CONFIRMATION   RPR Titer 03/13/2023 1:2   Final   Performed at [facility], [place] [address], [place], [place] [address]  Office Visit on 02/18/2023  Component Date Value Ref Range Status   HCV Ab 02/18/2023 Reactive (A)  Non Reactive Final   Hepatitis C Quantitation 02/18/2023 20  IU/mL Final   HCV log10 02/18/2023 1.301  log10 IU/mL Final   Test Information (HCV): 02/18/2023 Comment   Final   The quantitative range of this assay is 15 IU/mL to 100 million IU/mL.   Interpretation (HCV): 02/18/2023 Comment   Final   Comment: Positive HCV antibody screen with the presence of HCV RNA is consistent with active infection.   Admission on 10/05/2022, Discharged on 10/08/2022  Component Date Value Ref Range Status   TSH 10/06/2022 0.804  0.350 - 4.500 uIU/mL Final   Comment: Performed by a 3rd Generation assay with a  functional sensitivity of <=0.01 uIU/mL. Performed at [facility], [place] [address], [place], [place] [address]    Neisseria Gonorrhea 10/06/2022 Negative   Final   Chlamydia 10/06/2022 Negative   Final   Comment 10/06/2022 Normal Reference [place] Chlamydia - Negative   Final   Comment 10/06/2022 Normal Reference Range Neisseria Gonorrhea - Negative   Final   RPR Ser Ql 10/06/2022 Reactive (A)  NON REACTIVE Final   SENT FOR CONFIRMATION   RPR Titer 10/06/2022 1:2   Final   Performed at [facility], [place] [address], [place], [place] [address]   HIV Screen 4th Generation wRfx 10/06/2022 Non Reactive  Non Reactive Final   Performed at [facility], [place] [address], [place], [place] [address]   Cholesterol 10/06/2022 160  0 - 200 mg/dL Final   Triglycerides 10/06/2022 78  <150 mg/dL Final   HDL 10/06/2022 50  >40 mg/dL Final   Total CHOL/HDL Ratio 10/06/2022 3.2  RATIO  Final   VLDL 10/06/2022 16  0 - 40 mg/dL Final   LDL Cholesterol 10/06/2022 94  0 - 99 mg/dL Final   Comment:        Total Cholesterol/HDL:CHD Risk Coronary Heart Disease Risk Table                     Men   Women  1/2 Average Risk   3.4   3.3  Average Risk       5.0   4.4  2 X Average Risk   9.6   7.1  3 X Average Risk  23.4   11.0        Use the calculated Patient Ratio above and the CHD Risk Table to determine the patient's CHD Risk.        ATP III CLASSIFICATION (LDL):  <100     mg/dL   Optimal  100-129  mg/dL   Near or Above                    Optimal  130-159  mg/dL   Borderline  160-189  mg/dL   High  >190     mg/dL   Very High Performed at [place] [address], [place], [place] [address]    T Pallidum Abs 10/06/2022 Reactive (A)  Non Reactive Final   Comment: (NOTE) Performed At: [facility] [place], [place] [id number] Rush Farmer MD [id number]   Admission on 10/04/2022, Discharged on 10/05/2022  Component Date Value Ref Range Status   SARS  Coronavirus 2 by RT PCR 10/04/2022 NEGATIVE  NEGATIVE Final   Comment: (NOTE) SARS-CoV-2 target nucleic acids are NOT DETECTED.  The SARS-CoV-2 RNA is generally detectable in upper respiratory specimens during the acute phase of infection. The lowest concentration of SARS-CoV-2 viral copies this assay can detect is 138 copies/mL. A negative result does not preclude SARS-Cov-2 infection and should not be used as the sole basis for treatment or other patient management decisions. A negative result may occur with  improper specimen collection/handling, submission of specimen other than nasopharyngeal swab, presence of viral mutation(s) within the areas targeted by this assay, and inadequate number of viral copies(<138 copies/mL). A negative result must be combined with clinical observations, patient history, and epidemiological information. The expected result is Negative.  Fact Sheet for Patients:  EntrepreneurPulse.com.au  Fact Sheet for Healthcare Providers:  IncredibleEmployment.be  This test is no                          t yet approved or cleared by the [place] FDA and  has been authorized for detection and/or diagnosis of SARS-CoV-2 by FDA under an Emergency Use Authorization (EUA). This EUA will remain  in effect (meaning this test can be used) for the duration of the COVID-19 declaration under Section 564(b)(1) of the Act, 21 U.S.C.section 360bbb-3(b)(1), unless the authorization is terminated  or revoked sooner.       Influenza A by PCR 10/04/2022 NEGATIVE  NEGATIVE Final   Influenza B by PCR 10/04/2022 NEGATIVE  NEGATIVE Final   Comment: (NOTE) The Xpert Xpress SARS-CoV-2/FLU/RSV plus assay is intended as an aid in the diagnosis of influenza from Nasopharyngeal swab specimens and should not be used as a sole basis for treatment. Nasal washings and aspirates are unacceptable for Xpert Xpress SARS-CoV-2/FLU/RSV testing.  Fact  Sheet for Patients: EntrepreneurPulse.com.au  Fact Sheet for Healthcare Providers:  IncredibleEmployment.be  This test is not yet approved or cleared by the [place] and has been authorized for detection and/or diagnosis of SARS-CoV-2 by FDA under an Emergency Use Authorization (EUA). This EUA will remain in effect (meaning this test can be used) for the duration of the COVID-19 declaration under Section 564(b)(1) of the Act, 21 U.S.C. section 360bbb-3(b)(1), unless the authorization is terminated or revoked.  Performed at [facility], [place] [address] [place], [place] [address]    Sodium 10/04/2022 141  135 - 145 mmol/L Final   Potassium 10/04/2022 3.4 (L)  3.5 - 5.1 mmol/L Final   Chloride 10/04/2022 112 (H)  98 - 111 mmol/L Final   CO2 10/04/2022 22  22 - 32 mmol/L Final   Glucose, Bld 10/04/2022 107 (H)  70 - 99 mg/dL Final   Glucose reference range applies only to samples taken after fasting for at least 8 hours.   BUN 10/04/2022 11  6 - 20 mg/dL Final   Creatinine, Ser 10/04/2022 0.69  0.44 - 1.00 mg/dL Final   Calcium 10/04/2022 8.9  8.9 - 10.3 mg/dL Final   Total Protein 10/04/2022 7.4  6.5 - 8.1 g/dL Final   Albumin 10/04/2022 3.4 (L)  3.5 - 5.0 g/dL Final   AST 10/04/2022 33  15 - 41 U/L Final   ALT 10/04/2022 34  0 - 44 U/L Final   Alkaline Phosphatase 10/04/2022 59  38 - 126 U/L Final   Total Bilirubin 10/04/2022 0.5  0.3 - 1.2 mg/dL Final   GFR, Estimated 10/04/2022 >60  >60 mL/min Final   Comment: (NOTE) Calculated using the CKD-EPI Creatinine Equation (2021)    Anion gap 10/04/2022 7  5 - 15 Final   Performed at [place] [address] [place], [place] [address]   Alcohol, Ethyl (B) 10/04/2022 <10  <10 mg/dL Final   Comment: (NOTE) Lowest detectable limit for serum alcohol is 10 mg/dL.  For medical purposes only. Performed at [facility], [place] [address] [place], [place] [address]     Opiates 10/04/2022 NONE DETECTED  NONE DETECTED Final   Cocaine 10/04/2022 POSITIVE (A)  NONE DETECTED Final   Benzodiazepines 10/04/2022 NONE DETECTED  NONE DETECTED Final   Amphetamines 10/04/2022 POSITIVE (A)  NONE DETECTED Final   Tetrahydrocannabinol 10/04/2022 POSITIVE (A)  NONE DETECTED Final   Barbiturates 10/04/2022 NONE DETECTED  NONE DETECTED Final   Comment: (NOTE) DRUG SCREEN FOR MEDICAL PURPOSES ONLY.  IF CONFIRMATION IS NEEDED FOR ANY PURPOSE, NOTIFY LAB WITHIN 5 DAYS.  LOWEST DETECTABLE LIMITS FOR URINE DRUG SCREEN Drug Class                     Cutoff (ng/mL) Amphetamine and metabolites    1000 Barbiturate and metabolites    200 Benzodiazepine                 200 Opiates and metabolites        300 Cocaine and metabolites        300 THC                            50 Performed at [facility], [place] [address] [place], [place] [address]    WBC 10/04/2022 11.5 (H)  4.0 - 10.5 K/uL Final   RBC 10/04/2022 3.91  3.87 - 5.11 MIL/uL Final   Hemoglobin 10/04/2022 8.1 (L)  12.0 -  15.0 g/dL Final   Comment: Reticulocyte Hemoglobin testing may be clinically indicated, consider ordering this additional test [id number]    HCT 10/04/2022 27.6 (L)  36.0 - 46.0 % Final   MCV 10/04/2022 70.6 (L)  80.0 - 100.0 fL Final   MCH 10/04/2022 20.7 (L)  26.0 - 34.0 pg Final   MCHC 10/04/2022 29.3 (L)  30.0 - 36.0 g/dL Final   RDW 10/04/2022 21.2 (H)  11.5 - 15.5 % Final   Platelets 10/04/2022 521 (H)  150 - 400 K/uL Final   nRBC 10/04/2022 0.0  0.0 - 0.2 % Final   Neutrophils Relative % 10/04/2022 72  % Final   Neutro Abs 10/04/2022 8.3 (H)  1.7 - 7.7 K/uL Final   Lymphocytes Relative 10/04/2022 21  % Final   Lymphs Abs 10/04/2022 2.5  0.7 - 4.0 K/uL Final   Monocytes Relative 10/04/2022 5  % Final   Monocytes Absolute 10/04/2022 0.6  0.1 - 1.0 K/uL Final   Eosinophils Relative 10/04/2022 1  % Final   Eosinophils Absolute 10/04/2022 0.1  0.0 - 0.5 K/uL Final   Basophils  Relative 10/04/2022 0  % Final   Basophils Absolute 10/04/2022 0.0  0.0 - 0.1 K/uL Final   Immature Granulocytes 10/04/2022 1  % Final   Abs Immature Granulocytes 10/04/2022 0.06  0.00 - 0.07 K/uL Final   Performed at [facility], [place] [address] [place], [place] [address]   I-stat hCG, quantitative 10/04/2022 <5.0  <5 mIU/mL Final   Comment 3 10/04/2022          Final   Comment:   GEST. AGE      CONC.  (mIU/mL)   <=1 WEEK        5 - 50     2 WEEKS       50 - 500     3 WEEKS       100 - 10,000     4 WEEKS     1,000 - 30,000        Donna Zamora AND NON-PREGNANT Donna Zamora:     LESS THAN 5 mIU/mL    Salicylate Lvl 0000000 <7.0 (L)  7.0 - 30.0 mg/dL Final   Performed at [facility], [place] [address] [place], [place] [address]   Acetaminophen (Tylenol), Serum 10/04/2022 <10 (L)  10 - 30 ug/mL Final   Comment: (NOTE) Therapeutic concentrations vary significantly. A range of 10-30 ug/mL  may be an effective concentration for many patients. However, some  are best treated at concentrations outside of this range. Acetaminophen concentrations >150 ug/mL at 4 hours after ingestion  and >50 ug/mL at 12 hours after ingestion are often associated with  toxic reactions.  Performed at [facility], [place] [address], [place], [place] [address]    Color, Urine 10/04/2022 YELLOW  YELLOW Final   APPearance 10/04/2022 HAZY (A)  CLEAR Final   Specific Gravity, Urine 10/04/2022 1.028  1.005 - 1.030 Final   pH 10/04/2022 6.0  5.0 - 8.0 Final   Glucose, UA 10/04/2022 NEGATIVE  NEGATIVE mg/dL Final   Hgb urine dipstick 10/04/2022 NEGATIVE  NEGATIVE Final   Bilirubin Urine 10/04/2022 NEGATIVE  NEGATIVE Final   Ketones, ur 10/04/2022 NEGATIVE  NEGATIVE mg/dL Final   Protein, ur 10/04/2022 30 (A)  NEGATIVE mg/dL Final   Nitrite 10/04/2022 NEGATIVE  NEGATIVE Final   Leukocytes,Ua 10/04/2022 NEGATIVE  NEGATIVE Final   RBC / HPF 10/04/2022 0-5  0 - 5 RBC/hpf  Final   WBC, UA 10/04/2022 0-5  0 - 5  WBC/hpf Final   Bacteria, UA 10/04/2022 NONE SEEN  NONE SEEN Final   Squamous Epithelial / HPF 10/04/2022 6-10  0 - 5 Final   Mucus 10/04/2022 PRESENT   Final   Performed at [facility], [place] [address] [place], [place] [address]    Blood Alcohol level:  Lab Results  Component Value Date   ETH <10 0000000    Metabolic Disorder Labs: Lab Results  Component Value Date   HGBA1C 5.6 03/11/2023   MPG 114 03/11/2023   No results found for: "PROLACTIN" Lab Results  Component Value Date   CHOL 160 10/06/2022   TRIG 78 10/06/2022   HDL 50 10/06/2022   CHOLHDL 3.2 10/06/2022   VLDL 16 10/06/2022   LDLCALC 94 10/06/2022    Therapeutic Lab Levels: No results found for: "LITHIUM" No results found for: "VALPROATE" No results found for: "CBMZ"  Physical Findings   PHQ2-9    [place] ED from 03/11/2023 in [facility] ED from 10/05/2022 in [facility]  PHQ-2 Total Score 6 2  PHQ-9 Total Score 20 8      [place] ED from 03/11/2023 in [facility] Most recent reading at 03/11/2023  6:30 PM ED from 10/04/2022 in [facility] Emergency Department at [facility] Most recent reading at 10/04/2022  8:24 PM ED from 10/04/2022 in [facility] Emergency Department at [facility] Most recent reading at 10/04/2022  5:55 PM  C-SSRS RISK CATEGORY No Risk High Risk No Risk        Musculoskeletal  Strength & Muscle Tone: within normal limits Gait & Station: normal Patient leans: N/A  Psychiatric Specialty Exam  Presentation  General Appearance:  Tired and restless  Eye Contact: Good  Speech: Clear and Coherent; Normal Rate  Speech Volume: Normal  Handedness: Right   Mood and Affect  Mood: Euthymic  Affect: Congruent   Thought Process  Thought Processes: Coherent; Goal Directed; Linear  Descriptions of Associations:Intact  Orientation:Full  (Time, Place and Person)  Thought Content:WDL; Logical  Diagnosis of Schizophrenia or Schizoaffective disorder in past: Yes    Hallucinations:Hallucinations: None   Ideas of Reference:None  Suicidal Thoughts:Suicidal Thoughts: No   Homicidal Thoughts:Homicidal Thoughts: No    Sensorium  Memory: Immediate Good; Recent Good  Judgment: Fair  Insight: Fair   Materials engineer: Fair  Attention Span: Fair  Recall: [employer] of Knowledge: Fair  Language: Fair   Psychomotor Activity  Psychomotor Activity: Psychomotor Activity: Normal    Assets  Assets: Armed forces logistics/support/administrative officer; Desire for Improvement; Housing; Leisure Time; Physical Health; Social Support; Vocational/Educational   Sleep  Sleep: Fair     Physical Exam  Physical Exam Vitals reviewed.  Constitutional:      General: She is not in acute distress.    Appearance: She is not toxic-appearing.  HENT:     Head: Normocephalic.  Pulmonary:     Effort: Pulmonary effort is normal. No respiratory distress.  Neurological:     Mental Status: She is oriented to person, place, and time.     Motor: No weakness.     Gait: Gait normal.    Review of Systems  Constitutional:  Negative for malaise/fatigue.  Respiratory:  Negative for shortness of breath.   Cardiovascular:  Negative for chest pain.  Gastrointestinal:  Negative for abdominal pain, constipation, diarrhea, nausea and vomiting.  Neurological:  Negative for dizziness, tremors and headaches.   Blood pressure (!) 112/58, pulse 68, temperature 98.1 F (36.7 C), temperature source Oral, resp. rate 18, last menstrual period 01/21/2023, SpO2 100 %, unknown if currently breastfeeding. There is no height or weight on file to calculate BMI.  Treatment Plan Summary: Daily contact with patient to assess and evaluate symptoms and progress in treatment and Medication management  AUD - Discontinue CIWA protocol as patient with CIWA  0 for >48 hours. -Continue protocol of withdrawal with po thiamine and MVI replacement   H/o Schizophrenia Depressive episode - Continue home Seroquel 50 mg qHS  Syphilis -Due to the penicillin allergy, will initiate doxycycline 100 mg twice daily x 14 days  Dispo: Patient accepted to [facility] on Tuesday, 4/2.  Rosezetta Schlatter, MD 03/15/2023, 9:03 AM

## 2023-03-15 NOTE — ED Notes (Signed)
Patient in room - no sxs of distress noted at this time - will continue to monitor for safety

## 2023-03-15 NOTE — ED Notes (Signed)
SPIRITUALITY GROUP NOTE  Spirituality group facilitated by Chaplain Ehtan Delfavero, MDiv, BCC.  Group Description:  Group focused on topic of hope.  Patients participated in facilitated discussion around topic, connecting with one another around experiences and definitions for hope.  Group members engaged with visual explorer photos, reflecting on what hope looks like for them today.  Group engaged in discussion around how their definitions of hope are present today in hospital.   Modalities: Psycho-social ed, Adlerian, Narrative, MI Patient Progress:  

## 2023-03-16 DIAGNOSIS — F1010 Alcohol abuse, uncomplicated: Secondary | ICD-10-CM | POA: Diagnosis not present

## 2023-03-16 DIAGNOSIS — Z1152 Encounter for screening for COVID-19: Secondary | ICD-10-CM | POA: Diagnosis not present

## 2023-03-16 DIAGNOSIS — F1414 Cocaine abuse with cocaine-induced mood disorder: Secondary | ICD-10-CM | POA: Diagnosis not present

## 2023-03-16 DIAGNOSIS — F1994 Other psychoactive substance use, unspecified with psychoactive substance-induced mood disorder: Secondary | ICD-10-CM | POA: Diagnosis not present

## 2023-03-16 MED ORDER — DOXYCYCLINE HYCLATE 100 MG PO TABS: 100.0000 mg | ORAL_TABLET | Freq: Two times a day (BID) | ORAL | 0 refills | Status: DC

## 2023-03-16 MED ORDER — QUETIAPINE FUMARATE 50 MG PO TABS: 50.0000 mg | ORAL_TABLET | Freq: Every day | ORAL | 0 refills | Status: AC

## 2023-03-16 NOTE — ED Notes (Signed)
Patient A&O x 4, ambulatory. Patient discharged in no acute distress. Patient denied SI/HI, A/VH upon discharge. Patient verbalized understanding of all discharge instructions explained by staff, to include follow up appointments, RX's and safety. Pt belongings returned to patient from locker #  29 intact. Patient escorted to lobby via staff for transport to destination. Safety maintained. Patient was given her prescriptions

## 2023-03-16 NOTE — ED Notes (Signed)
Patient would like discharge . Rn notified the provider.

## 2023-03-16 NOTE — ED Notes (Signed)
Pt is in the bed sleeping. Respirations are even and unlabored. No acute distress noted. Will continue to monitor for safety. 

## 2023-03-16 NOTE — ED Notes (Signed)
Patient in room. Environment is secured. Will continue to monitor for safety. 

## 2023-03-16 NOTE — Group Note (Signed)
Group Topic: Communication  Group Date: 03/16/2023 Start Time: [place] End Time: [place] Facilitators: Sherrlyn Hock, RN  Department: [facility]  Number of Participants: 4  Group Focus: suicide prevention skills Treatment Modality:  Cognitive Behavioral Therapy Interventions utilized were assignment Purpose: enhance coping skills  Name: Tanea Borges Date of Birth: 06-09-1986  MR: [id number]    Level of Participation: active Quality of Participation: engaged Interactions with others: gave feedback Mood/Affect: appropriate Triggers (if applicable):  Cognition: coherent/clear Progress: Minimal Response:  Plan: patient will be encouraged to refer to   Patients Problems:  Patient Active Problem List   Diagnosis Date Noted   Syphilis 02/18/2023   History of hepatitis C 02/18/2023   Marijuana abuse 02/18/2023   Alcohol abuse 02/18/2023   Cocaine abuse ([place]) 02/18/2023   Tobacco abuse 02/18/2023   History of crack cocaine use 02/18/2023   Substance induced mood disorder ([place]) 10/05/2022   Suicidal ideation 10/05/2022

## 2023-03-16 NOTE — ED Notes (Signed)
Patient request pain medication for her tooth .She was given tylenol 650 mg

## 2023-03-16 NOTE — ED Provider Notes (Signed)
FBC/OBS ASAP Discharge Summary  Date and Time: 03/16/2023 2:03 PM  Name: Donna Zamora  MRN:  [id number]   Discharge Diagnoses:  Final diagnoses:  Substance induced mood disorder ([place])  Cocaine abuse ([place])  Alcohol abuse    Subjective:  Donna Zamora is a 37 yr old female who presented on 3/25 to [facility] requesting help to Detox and go to Rehab, she was admitted to [facility] on 3/25.  PPHx is significant for substance abuse, schizophrenia, multiple personality disorder, PTSD. multiple inpatient psychiatric hospitalizations.  She reports that she is doing well today.  She reports no withdrawal symptoms.  She reports mild cravings today.  She reports that she wants to be discharged.  Discussed with her that it is important that she continue to take her doxycycline to completion.  Encouraged her to keep her intake appointment for [facility] on Tuesday 4/2.  She reports no SI, HI, or AVH.  She reports her sleep is fair.  She reports her appetite is good.  She reports no other concerns at present.   Stay Summary: She presented on 3/25 to [facility] requesting help to Detox and go to Rehab.  She was admitted to [facility] on 3/25.  She tolerated detox.  It was arranged for her to be accepted to [facility] on 4/2.  It was discovered that she was positive for syphilis and so doxycycline was started.  She requested to be discharged on 3/30.  Encouraged her to keep her intake appointment with [facility] on 4/2.  Discussed the importance of finishing her Doxycycline prescription and following up with her PCP.  She was discharged.  Total Time spent with patient: 30 minutes  Past Psychiatric History: self reported history of substance abuse, schizophrenia, multiple personality disorder, PTSD. multiple inpatient psychiatric hospitalizations, with last reported inpatient psychiatric hospitalization a couple years ago.  Past Medical History: no significant medical history Family History: unknown Family Psychiatric  History: unknown Social History:  Patient reports that she lives with a friend but this is a place where she gets high, no other housing options.  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  No communication with family.  Uses cocaine and alcohol. Tobacco Cessation:  A prescription for an FDA-approved tobacco cessation medication provided at discharge  Current Medications:  Current Facility-Administered Medications  Medication Dose Route Frequency Provider Last Rate Last Admin   acetaminophen (TYLENOL) tablet 650 mg  650 mg Oral Q6H PRN Revonda Humphrey, NP   650 mg at 03/16/23 0838   alum & mag hydroxide-simeth (MAALOX/MYLANTA) 200-200-20 MG/5ML suspension 30 mL  30 mL Oral Q4H PRN Revonda Humphrey, NP       dextromethorphan-guaiFENesin (MUCINEX DM) 30-600 MG per 12 hr tablet 1 tablet  1 tablet Oral BID PRN Onuoha, Chinwendu V, NP   1 tablet at 03/11/23 2326   doxycycline (VIBRA-TABS) tablet 100 mg  100 mg Oral Q12H Rosezetta Schlatter, MD   100 mg at 03/16/23 [id number]   magnesium hydroxide (MILK OF MAGNESIA) suspension 30 mL  30 mL Oral Daily PRN Revonda Humphrey, NP       melatonin tablet 3 mg  3 mg Oral QHS PRN Onuoha, Chinwendu V, NP   3 mg at 03/15/23 2111   multivitamin with minerals tablet 1 tablet  1 tablet Oral Daily Revonda Humphrey, NP   1 tablet at 03/16/23 0944   QUEtiapine (SEROQUEL) tablet 50 mg  50 mg Oral QHS Rosezetta Schlatter, MD   50 mg at 03/15/23 2111  thiamine (VITAMIN B1) tablet 100 mg  100 mg Oral Daily Revonda Humphrey, NP   100 mg at 03/16/23 [id number]   Current Outpatient Medications  Medication Sig Dispense Refill   doxycycline (VIBRA-TABS) 100 MG tablet Take 1 tablet (100 mg total) by mouth every 12 (twelve) hours. 26 tablet 0   QUEtiapine (SEROQUEL) 50 MG tablet Take 1 tablet (50 mg total) by mouth at bedtime. 30 tablet 0    PTA Medications:  Facility Ordered Medications  Medication   acetaminophen (TYLENOL) tablet 650 mg   alum & mag hydroxide-simeth (MAALOX/MYLANTA) 200-200-20 MG/5ML  suspension 30 mL   magnesium hydroxide (MILK OF MAGNESIA) suspension 30 mL   [COMPLETED] thiamine (VITAMIN B1) injection 100 mg   thiamine (VITAMIN B1) tablet 100 mg   multivitamin with minerals tablet 1 tablet   [EXPIRED] LORazepam (ATIVAN) tablet 1 mg   [EXPIRED] hydrOXYzine (ATARAX) tablet 25 mg   [EXPIRED] loperamide (IMODIUM) capsule 2-4 mg   [EXPIRED] ondansetron (ZOFRAN-ODT) disintegrating tablet 4 mg   dextromethorphan-guaiFENesin (MUCINEX DM) 30-600 MG per 12 hr tablet 1 tablet   melatonin tablet 3 mg   [COMPLETED] polyethylene glycol (MIRALAX / GLYCOLAX) packet 17 g   QUEtiapine (SEROQUEL) tablet 50 mg   doxycycline (VIBRA-TABS) tablet 100 mg   PTA Medications  Medication Sig   QUEtiapine (SEROQUEL) 50 MG tablet Take 1 tablet (50 mg total) by mouth at bedtime.   doxycycline (VIBRA-TABS) 100 MG tablet Take 1 tablet (100 mg total) by mouth every 12 (twelve) hours.       03/16/2023    1:43 PM 03/15/2023    1:47 PM 03/11/2023    5:54 PM  Depression screen PHQ 2/9  Decreased Interest 3 1 3   Down, Depressed, Hopeless 3 0 3  PHQ - 2 Score 6 1 6   Altered sleeping 2  3  Tired, decreased energy 3  3  Change in appetite 2  2  Feeling bad or failure about yourself  2  3  Trouble concentrating 2  1  Moving slowly or fidgety/restless 1  1  Suicidal thoughts 1  1  PHQ-9 Score 19  20  Difficult doing work/chores Extremely dIfficult  Very difficult    Flowsheet Row ED from 03/11/2023 in [facility] Most recent reading at 03/11/2023  6:30 PM ED from 10/04/2022 in [facility] Emergency Department at [facility] Most recent reading at 10/04/2022  8:24 PM ED from 10/04/2022 in [facility] Emergency Department at [facility] Most recent reading at 10/04/2022  5:55 PM  C-SSRS RISK CATEGORY No Risk High Risk No Risk       Musculoskeletal  Strength & Muscle Tone: within normal limits Gait & Station: normal Patient leans: N/A  Psychiatric  Specialty Exam  Presentation  General Appearance:  Appropriate for Environment; Casual  Eye Contact: Fair  Speech: Clear and Coherent; Normal Rate  Speech Volume: Normal  Handedness: Right   Mood and Affect  Mood: -- ("ok")  Affect: Congruent   Thought Process  Thought Processes: Coherent; Goal Directed  Descriptions of Associations:Intact  Orientation:Full (Time, Place and Person)  Thought Content:Logical; WDL  Diagnosis of Schizophrenia or Schizoaffective disorder in past: Yes    Hallucinations:Hallucinations: None  Ideas of Reference:None  Suicidal Thoughts:Suicidal Thoughts: No  Homicidal Thoughts:Homicidal Thoughts: No   Sensorium  Memory: Immediate Fair  Judgment: Fair  Insight: Fair   Materials engineer: Fair  Attention Span: Fair  Recall: [employer] of Knowledge: Fair  Language: Fair   Psychomotor Activity  Psychomotor Activity: Psychomotor Activity: Normal   Assets  Assets: Communication Skills; Desire for Improvement; Physical Health; Resilience; Vocational/Educational; Social Support   Sleep  Sleep: Sleep: Fair   No data recorded  Physical Exam  Physical Exam Vitals and nursing note reviewed.  Constitutional:      General: She is not in acute distress.    Appearance: Normal appearance. She is obese. She is not ill-appearing or toxic-appearing.  HENT:     Head: Normocephalic and atraumatic.  Pulmonary:     Effort: Pulmonary effort is normal.  Neurological:     Mental Status: She is alert.    Review of Systems  Respiratory:  Negative for cough and shortness of breath.   Cardiovascular:  Negative for chest pain.  Gastrointestinal:  Negative for abdominal pain, constipation, diarrhea, nausea and vomiting.  Neurological:  Negative for dizziness, weakness and headaches.  Psychiatric/Behavioral:  Negative for depression, hallucinations and suicidal ideas. The patient is not  nervous/anxious.    Blood pressure (!) 103/56, pulse 67, temperature (!) 97.4 F (36.3 C), temperature source Oral, resp. rate 18, last menstrual period 01/21/2023, SpO2 100 %, unknown if currently breastfeeding. There is no height or weight on file to calculate BMI.  Demographic Factors:  Low socioeconomic status and Unemployed  Loss Factors: Financial problems/change in socioeconomic status  Historical Factors: Impulsivity  Risk Reduction Factors:   Sense of responsibility to family, Positive social support, Positive therapeutic relationship, and Positive coping skills or problem solving skills  Continued Clinical Symptoms:  Depression:   Comorbid alcohol abuse/dependence More than one psychiatric diagnosis Previous Psychiatric Diagnoses and Treatments Medical Diagnoses and Treatments/Surgeries  Cognitive Features That Contribute To Risk:  Closed-mindedness    Suicide Risk:  Minimal: No identifiable suicidal ideation.  Patients presenting with no risk factors but with morbid ruminations; may be classified as minimal risk based on the severity of the depressive symptoms  Plan Of Care/Follow-up recommendations/Disposition:  You have been accepted to [facility] for Tuesday 4/2, please ensure you make this appointment.  Activity: as tolerated  Diet: heart healthy  Other: -Follow-up with your outpatient psychiatric provider -instructions on appointment date, time, and address (location) are provided to you in discharge paperwork.  -Take your psychiatric medications as prescribed at discharge - instructions are provided to you in the discharge paperwork  -Follow-up with outpatient primary care doctor and other specialists -for management of chronic medical disease, including: Complete your Doxycycline prescription and follow up with your PCP.  Ensure you make your appointment at [facility] on Tuesday 4/2  -Testing: Follow-up with outpatient provider for abnormal lab results: CBC values.   Positive RPR and T pallidum  -Recommend abstinence from alcohol, tobacco, and other illicit drug use at discharge.   -If your psychiatric symptoms recur, worsen, or if you have side effects to your psychiatric medications, call your outpatient psychiatric provider, 911, [phone] or go to the nearest emergency department.  -If suicidal thoughts recur, call your outpatient psychiatric provider, 911, [phone] or go to the nearest emergency department.   Briant Cedar, MD 03/16/2023, 2:03 PM

## 2023-03-16 NOTE — ED Notes (Signed)
Patient alert and oriented x 3. Denies SI/HI/AVH. Denies intent or plan to harm self or others. Routine conducted according to faculty protocol. Encourage patient to notify staff with any needs or concerns. Patient verbalized agreement and understanding. Will continue to monitor for safety. 

## 2023-03-16 NOTE — ED Notes (Signed)
Patient sleeping with no sxs of distress noted - will continue to monitor for safety 

## 2023-03-17 ENCOUNTER — Ambulatory Visit (HOSPITAL_COMMUNITY)
Admission: EM | Admit: 2023-03-17 | Discharge: 2023-03-17 | Disposition: A | Discharge status: Home / Self Care | Payer: Medicaid Other | Attending: Psychiatry | Admitting: Psychiatry

## 2023-03-17 DIAGNOSIS — F1994 Other psychoactive substance use, unspecified with psychoactive substance-induced mood disorder: Secondary | ICD-10-CM | POA: Insufficient documentation

## 2023-03-17 NOTE — Discharge Instructions (Addendum)

## 2023-03-17 NOTE — ED Provider Notes (Signed)
Behavioral Health Urgent Care Medical Screening Exam  Patient Name: Donna Zamora MRN: [id number] Date of Evaluation: 03/17/23 Chief Complaint:   Diagnosis:  Final diagnoses:  Substance induced mood disorder ([place])    History of Present illness: Donna Zamora is a 37 y.o. female.  Presents to [facility] urgent care requesting to be readmitted to [facility]. [facility])  Patient was recently discharged on 03/16/2023.  States she went to use cocaine and drank a little alcohol.  Reports she had a follow-up appointment this Tuesday for [facility].   She denies suicidal or homicidal ideations.  Denies auditory or  visual hallucinations.  No available beds at this time.  Patient to follow-up with substance abuse treatment facilities as found out on her discharge summary.  During evaluation Donna Zamora in no acute distress. She is alert/oriented x 4; calm/cooperative; and mood congruent with affect.  She is speaking in a clear tone at moderate volume, and normal pace; with good eye contact. Her thought process is coherent and relevant; There is no indication that she is currently responding to internal/external stimuli or experiencing delusional thought content; and she has denied suicidal/self-harm/homicidal ideation, psychosis, and paranoia. Patient has remained calm throughout assessment and has answered questions appropriately.    Donna Zamora is educated and verbalizes understanding of mental health resources and other crisis services in the community. She is instructed to call 911 and present to the nearest emergency room should she experience any suicidal/homicidal ideation, auditory/visual/hallucinations, or detrimental worsening of her mental health condition.  She was a also advised by Probation officer that she could call the toll-free phone on insurance card to assist with identifying in network counselors and agencies or number on back of Medicaid card to speak with care coordinator.    [place] ED  from 03/17/2023 in [facility] ED from 03/11/2023 in [facility] ED from 10/04/2022 in [facility] Emergency Department at [place] No Risk No Risk High Risk       Psychiatric Specialty Exam  Presentation  General Appearance:Appropriate for Environment; Casual  Eye Contact:Fair  Speech:Clear and Coherent; Normal Rate  Speech Volume:Normal  Handedness:Right   Mood and Affect  Mood: -- ("ok")  Affect: Congruent   Thought Process  Thought Processes: Coherent; Goal Directed  Descriptions of Associations:Intact  Orientation:Full (Time, Place and Person)  Thought Content:Logical; WDL  Diagnosis of Schizophrenia or Schizoaffective disorder in past: Yes   Hallucinations:None  Ideas of Reference:None  Suicidal Thoughts:No  Homicidal Thoughts:No   Sensorium  Memory: Immediate Fair  Judgment: Fair  Insight: Fair   Community education officer  Concentration: Fair  Attention Span: Fair  Recall: [employer] of Knowledge: Fair  Language: Fair   Psychomotor Activity  Psychomotor Activity: Normal   Assets  Assets: Armed forces logistics/support/administrative officer; Desire for Improvement; Physical Health; Resilience; Vocational/Educational; Social Support   Sleep  Sleep: Fair  Number of hours:  6   Physical Exam: Physical Exam Vitals and nursing note reviewed.  Cardiovascular:     Rate and Rhythm: Normal rate and regular rhythm.  Pulmonary:     Effort: Pulmonary effort is normal.     Breath sounds: Normal breath sounds.  Neurological:     Mental Status: She is alert.  Psychiatric:        Mood and Affect: Mood normal.        Behavior: Behavior normal.        Thought Content: Thought content normal.  Review of Systems  Respiratory: Negative.    Cardiovascular: Negative.   Psychiatric/Behavioral:  Positive for substance abuse. Negative for suicidal ideas. The patient is  nervous/anxious.   All other systems reviewed and are negative.  Blood pressure (!) 133/99, pulse 100, resp. rate 18, last menstrual period 01/21/2023, SpO2 100 %, unknown if currently breastfeeding. There is no height or weight on file to calculate BMI.  Musculoskeletal: Strength & Muscle Tone: within normal limits Gait & Station: normal Patient leans: N/A   [place] [facility] Discharge Disposition for Follow up and Recommendations: Based on my evaluation the patient does not appear to have an emergency medical condition and can be discharged with resources and follow up care in outpatient services for Medication Management and Substance Abuse Intensive Outpatient Program   D[place]ill [place], NP 03/17/2023, 11:40 AM

## 2023-03-17 NOTE — ED Triage Notes (Signed)
Pt presents to [facility] voluntarily seeking substance use treatment. Pt was discharged from facility based crisis at [facility] yesterday and states she would like to return to the unit. Pt reports using $50 worth of cocaine, and 1 pint of alcohol yesterday. Pt denies SI/HI and AVH.

## 2023-03-17 NOTE — ED Notes (Signed)
Patient discharged by provider with written and verbal instructions. Resources given.

## 2023-09-03 ENCOUNTER — Ambulatory Visit (HOSPITAL_COMMUNITY)
Admission: EM | Admit: 2023-09-03 | Discharge: 2023-09-04 | Disposition: A | Discharge status: Home / Self Care | Payer: MEDICAID | Attending: Urology | Admitting: Urology

## 2023-09-03 DIAGNOSIS — F1994 Other psychoactive substance use, unspecified with psychoactive substance-induced mood disorder: Secondary | ICD-10-CM

## 2023-09-03 DIAGNOSIS — F1414 Cocaine abuse with cocaine-induced mood disorder: Secondary | ICD-10-CM | POA: Diagnosis not present

## 2023-09-03 DIAGNOSIS — F1910 Other psychoactive substance abuse, uncomplicated: Secondary | ICD-10-CM

## 2023-09-03 DIAGNOSIS — F1210 Cannabis abuse, uncomplicated: Secondary | ICD-10-CM | POA: Diagnosis not present

## 2023-09-03 DIAGNOSIS — R45851 Suicidal ideations: Secondary | ICD-10-CM | POA: Insufficient documentation

## 2023-09-03 DIAGNOSIS — F32A Depression, unspecified: Secondary | ICD-10-CM | POA: Diagnosis not present

## 2023-09-03 LAB — COMPREHENSIVE METABOLIC PANEL
ALT: 46 U/L — ABNORMAL HIGH (ref 0–44)
AST: 49 U/L — ABNORMAL HIGH (ref 15–41)
Albumin: 3.7 g/dL (ref 3.5–5.0)
Alkaline Phosphatase: 63 U/L (ref 38–126)
Anion gap: 15 (ref 5–15)
BUN: 13 mg/dL (ref 6–20)
CO2: 23 mmol/L (ref 22–32)
Calcium: 9.9 mg/dL (ref 8.9–10.3)
Chloride: 101 mmol/L (ref 98–111)
Creatinine, Ser: 0.69 mg/dL (ref 0.44–1.00)
GFR, Estimated: >60 mL/min (ref >60)
Glucose, Bld: 108 mg/dL — ABNORMAL HIGH (ref 70–99)
Potassium: 3.4 mmol/L — ABNORMAL LOW (ref 3.5–5.1)
Sodium: 139 mmol/L (ref 135–145)
Total Bilirubin: 0.5 mg/dL (ref 0.3–1.2)
Total Protein: 7.2 g/dL (ref 6.5–8.1)

## 2023-09-03 LAB — CBC WITH DIFFERENTIAL/PLATELET
Abs Immature Granulocytes: 0.04 10*3/uL (ref 0.00–0.07)
Basophils Absolute: 0.1 10*3/uL (ref 0.0–0.1)
Basophils Relative: 1 %
Eosinophils Absolute: 0.2 10*3/uL (ref 0.0–0.5)
Eosinophils Relative: 2 %
HCT: 33.9 % — ABNORMAL LOW (ref 36.0–46.0)
Hemoglobin: 10.6 g/dL — ABNORMAL LOW (ref 12.0–15.0)
Immature Granulocytes: 0 %
Lymphocytes Relative: 25 %
Lymphs Abs: 2.4 10*3/uL (ref 0.7–4.0)
MCH: 23 pg — ABNORMAL LOW (ref 26.0–34.0)
MCHC: 31.3 g/dL (ref 30.0–36.0)
MCV: 73.5 fL — ABNORMAL LOW (ref 80.0–100.0)
Monocytes Absolute: 0.6 10*3/uL (ref 0.1–1.0)
Monocytes Relative: 6 %
Neutro Abs: 6.5 10*3/uL (ref 1.7–7.7)
Neutrophils Relative %: 66 %
Platelets: 613 10*3/uL — ABNORMAL HIGH (ref 150–400)
RBC: 4.61 MIL/uL (ref 3.87–5.11)
RDW: 17.6 % — ABNORMAL HIGH (ref 11.5–15.5)
WBC: 9.8 10*3/uL (ref 4.0–10.5)
nRBC: 0 % (ref 0.0–0.2)

## 2023-09-03 LAB — POC URINE PREG, ED: Preg Test, Ur: NEGATIVE

## 2023-09-03 LAB — POCT URINE DRUG SCREEN - MANUAL ENTRY (I-SCREEN)
POC Amphetamine UR: NOT DETECTED
POC Buprenorphine (BUP): NOT DETECTED
POC Cocaine UR: POSITIVE — AB
POC Marijuana UR: POSITIVE — AB
POC Methadone UR: NOT DETECTED
POC Methamphetamine UR: NOT DETECTED
POC Morphine: NOT DETECTED
POC Oxazepam (BZO): NOT DETECTED
POC Oxycodone UR: NOT DETECTED
POC Secobarbital (BAR): NOT DETECTED

## 2023-09-03 LAB — TSH: TSH: 0.718 u[IU]/mL (ref 0.350–4.500)

## 2023-09-03 LAB — ETHANOL: Alcohol, Ethyl (B): <10 mg/dL (ref <10)

## 2023-09-03 MED ORDER — TRAZODONE HCL 50 MG PO TABS
50.0000 mg | ORAL_TABLET | Freq: Every evening | ORAL | Status: DC | PRN
  Administered 2023-09-03: 50 mg via ORAL
  Filled 2023-09-03: qty 1

## 2023-09-03 MED ORDER — ACETAMINOPHEN 325 MG PO TABS: 650.0000 mg | ORAL_TABLET | Freq: Four times a day (QID) | ORAL | Status: DC | PRN

## 2023-09-03 MED ORDER — ALUM & MAG HYDROXIDE-SIMETH 200-200-20 MG/5ML PO SUSP: 30.0000 mL | ORAL | Status: DC | PRN

## 2023-09-03 MED ORDER — HYDROXYZINE HCL 25 MG PO TABS: 25.0000 mg | ORAL_TABLET | Freq: Three times a day (TID) | ORAL | Status: DC | PRN

## 2023-09-03 MED ORDER — MAGNESIUM HYDROXIDE 400 MG/5ML PO SUSP: 30.0000 mL | Freq: Every day | ORAL | Status: DC | PRN

## 2023-09-03 MED ORDER — QUETIAPINE FUMARATE 50 MG PO TABS
50.0000 mg | ORAL_TABLET | Freq: Every day | ORAL | Status: DC
  Administered 2023-09-03: 50 mg via ORAL
  Filled 2023-09-03: qty 1

## 2023-09-03 NOTE — ED Notes (Signed)
Patient  sleeping in no acute stress. RR even and unlabored .Environment secured .Will continue to monitor for safely. 

## 2023-09-03 NOTE — ED Notes (Signed)
Patient in milieu. Environment is secured. Will continue to monitor for safety.

## 2023-09-03 NOTE — ED Notes (Signed)
Pt is resting in bed. Pt denies SI/HI/AVH. No acute distress noted. Will continue to monitor for safety and provides support.

## 2023-09-03 NOTE — ED Notes (Signed)
Patient observed/assessed in bed/chair resting quietly appearing in no distress and verbalizing no complaints at this time. Will continue to monitor.  

## 2023-09-03 NOTE — ED Provider Notes (Signed)
[facility] Urgent Care Continuous Assessment Admission H&P  Date: 09/03/23 Patient Name: Donna Zamora MRN: [id number] Chief Complaint: ""I'm in a bad mental space"  Diagnoses:  Final diagnoses:  Substance induced mood disorder (HCC)  Polysubstance abuse (HCC)    HPI: Donna Zamora is a 37 y/o female with a history of depression and crack cocaine abuse. Patient presented voluntarily reporting worsening depressive symptoms and passive suicidal ideation. Patient states "I'm in a bad mental space, I wish I was dead"   This NP assessed patient face to face, reviewed her chart, and consulted with Dr. Lucianne Muss regarding patient's care.   Patient reports feeling overwhelmed and despondent following a series of recent life events. She reports that she recently brook up with her boyfriend, which she describes as emotionally devastating. She also reports being kicked out of her sober home [facility] [place], [place]) due to a relapse, which she acknowledges was triggered by the breakup. The patient states she has been struggling with persistent low mood, feelings of worthlessness, crying uncontrollably, and difficulty finding motivation. She admits to recent cocaine use, which she attributes to her attempts to cope with her emotions and the stress from breaking up with her boyfriend of 2 years. She describes her sleep as erratic; she report that her appetite has decreased, and she has been feeling more isolated. The patient expresses a sense of hopelessness about her ability to regain control of her life and is uncertain about her next steps and feels that her current situation is a significant setback in her recovery process. She reports that she has not taking her medication, Seroquel 50mg /day. She would like to be restarted on this medication. She is open to seeking support and expresses a desire to stabilize her mood and regain control over her substance use.   She says she is unable to contract for safety and  expressive passive suicidal ideation of "I wish I was dead." She denies suicidal plan however she reports she is unable to contract for safety if discharged. She denies homicidal ideation, paranoia, and hallucination. She admits to crack cocaine use "a few hits" 2 days ago and smoking 1 marijuana blount yesterday. She denies all other substance use.  On assessment, patient is dishevel, patient was eating but would repeatedly fall asleep while eating and she need repeated prompts to answer assessment questions. When awake, she is noted to be anxious and fidgety, oriented x4.  she was cooperative. Her mood is anxious and depressed, affect is  congruent. Her speech was clear and coherent, thought procoss was coherent and goal directed. She did not appear to be responding to any internal/external stimuli.      Total Time spent with patient: 30 minutes  Musculoskeletal  Strength & Muscle Tone: within normal limits Gait & Station: normal Patient leans: Right  Psychiatric Specialty Exam  Presentation General Appearance:  Disheveled  Eye Contact: Fair  Speech: Clear and Coherent  Speech Volume: Normal  Handedness: Right   Mood and Affect  Mood: Anxious  Affect: Congruent   Thought Process  Thought Processes: Goal Directed  Descriptions of Associations:Intact  Orientation:Full (Time, Place and Person)  Thought Content:WDL  Diagnosis of Schizophrenia or Schizoaffective disorder in past: No   Hallucinations:Hallucinations: None  Ideas of Reference:None  Suicidal Thoughts:Suicidal Thoughts: Yes, Passive  Homicidal Thoughts:Homicidal Thoughts: No   Sensorium  Memory: Immediate Good; Recent Good; Remote Fair  Judgment: Fair  Insight: Fair   Art therapist  Concentration: Fair  Attention Span: Fair  Recall: Fair  Fund of Knowledge: Fair  Language: Fair   Psychomotor Activity  Psychomotor Activity: Psychomotor Activity:  Restlessness   Assets  Assets: Desire for Improvement; Communication Skills; Physical Health   Sleep  Sleep: Sleep: Fair Number of Hours of Sleep: 5   Nutritional Assessment (For OBS and FBC admissions only) Has the patient had a weight loss or gain of 10 pounds or more in the last 3 months?: No Has the patient had a decrease in food intake/or appetite?: No Does the patient have dental problems?: No Does the patient have eating habits or behaviors that may be indicators of an eating disorder including binging or inducing vomiting?: No Has the patient recently lost weight without trying?: 0 Has the patient been eating poorly because of a decreased appetite?: 0 Malnutrition Screening Tool Score: 0    Physical Exam Vitals and nursing note reviewed.  Constitutional:      General: She is not in acute distress.    Appearance: She is well-developed. She is not ill-appearing.  HENT:     Head: Normocephalic and atraumatic.  Cardiovascular:     Rate and Rhythm: Normal rate.  Pulmonary:     Effort: Pulmonary effort is normal.  Abdominal:     Palpations: Abdomen is soft.  Musculoskeletal:        General: Normal range of motion.     Cervical back: Normal range of motion.  Skin:    General: Skin is warm.  Neurological:     Mental Status: She is alert and oriented to person, place, and time.  Psychiatric:        Attention and Perception: Attention and perception normal.        Mood and Affect: Mood is anxious.        Speech: Speech normal.        Behavior: Behavior normal. Behavior is cooperative.        Thought Content: Thought content normal.    Review of Systems  Constitutional: Negative.   HENT: Negative.    Eyes: Negative.   Respiratory: Negative.    Cardiovascular: Negative.   Gastrointestinal: Negative.   Genitourinary: Negative.   Musculoskeletal: Negative.   Skin: Negative.   Neurological: Negative.   Endo/Heme/Allergies: Negative.   Psychiatric/Behavioral:   Positive for depression, substance abuse and suicidal ideas. The patient is nervous/anxious.     Blood pressure 131/76, pulse 78, temperature 98 F (36.7 C), temperature source Oral, resp. rate 18, SpO2 98%, unknown if currently breastfeeding. There is no height or weight on file to calculate BMI.  Past Psychiatric History:  Cocaine abuse, marijuana abuse, depression, substance induced mood disorder  Is the patient at risk to self? Yes  Has the patient been a risk to self in the past 6 months? Yes .    Has the patient been a risk to self within the distant past? Yes   Is the patient a risk to others? No   Has the patient been a risk to others in the past 6 months? No   Has the patient been a risk to others within the distant past? No   Past Medical History:  Past Medical History:  Diagnosis Date   Ectopic pregnancy      Family History: none reported by patient   Social History:  Social History   Tobacco Use   Smoking status: Every Day    Current packs/day: 0.50    Types: Cigarettes   Smokeless tobacco: Never  Substance Use Topics   Alcohol use:  No   Drug use: No     Last Labs:  Admission on 09/03/2023  Component Date Value Ref Range Status   WBC 09/03/2023 9.8  4.0 - 10.5 K/uL Final   RBC 09/03/2023 4.61  3.87 - 5.11 MIL/uL Final   Hemoglobin 09/03/2023 10.6 (L)  12.0 - 15.0 g/dL Final   HCT 40/98/1191 33.9 (L)  36.0 - 46.0 % Final   MCV 09/03/2023 73.5 (L)  80.0 - 100.0 fL Final   MCH 09/03/2023 23.0 (L)  26.0 - 34.0 pg Final   MCHC 09/03/2023 31.3  30.0 - 36.0 g/dL Final   RDW 47/82/9562 17.6 (H)  11.5 - 15.5 % Final   Platelets 09/03/2023 613 (H)  150 - 400 K/uL Final   nRBC 09/03/2023 0.0  0.0 - 0.2 % Final   Neutrophils Relative % 09/03/2023 66  % Final   Neutro Abs 09/03/2023 6.5  1.7 - 7.7 K/uL Final   Lymphocytes Relative 09/03/2023 25  % Final   Lymphs Abs 09/03/2023 2.4  0.7 - 4.0 K/uL Final   Monocytes Relative 09/03/2023 6  % Final   Monocytes  Absolute 09/03/2023 0.6  0.1 - 1.0 K/uL Final   Eosinophils Relative 09/03/2023 2  % Final   Eosinophils Absolute 09/03/2023 0.2  0.0 - 0.5 K/uL Final   Basophils Relative 09/03/2023 1  % Final   Basophils Absolute 09/03/2023 0.1  0.0 - 0.1 K/uL Final   Immature Granulocytes 09/03/2023 0  % Final   Abs Immature Granulocytes 09/03/2023 0.04  0.00 - 0.07 K/uL Final   Performed at [facility] Lab, [address] [place], [place] [address]   Preg Test, Ur 09/03/2023 Negative  Negative Final   POC Amphetamine UR 09/03/2023 None Detected  NONE DETECTED (Cut Off Level 1000 ng/mL) Final   POC Secobarbital (BAR) 09/03/2023 None Detected  NONE DETECTED (Cut Off Level 300 ng/mL) Final   POC Buprenorphine (BUP) 09/03/2023 None Detected  NONE DETECTED (Cut Off Level 10 ng/mL) Final   POC Oxazepam (BZO) 09/03/2023 None Detected  NONE DETECTED (Cut Off Level 300 ng/mL) Final   POC Cocaine UR 09/03/2023 Positive (A)  NONE DETECTED (Cut Off Level 300 ng/mL) Final   POC Methamphetamine UR 09/03/2023 None Detected  NONE DETECTED (Cut Off Level 1000 ng/mL) Final   POC Morphine 09/03/2023 None Detected  NONE DETECTED (Cut Off Level 300 ng/mL) Final   POC Methadone UR 09/03/2023 None Detected  NONE DETECTED (Cut Off Level 300 ng/mL) Final   POC Oxycodone UR 09/03/2023 None Detected  NONE DETECTED (Cut Off Level 100 ng/mL) Final   POC Marijuana UR 09/03/2023 Positive (A)  NONE DETECTED (Cut Off Level 50 ng/mL) Final  Admission on 03/11/2023, Discharged on 03/16/2023  Component Date Value Ref Range Status   SARS Coronavirus 2 by RT PCR 03/11/2023 NEGATIVE  NEGATIVE Final   Performed at [facility], [address], [place], [place] [address]   WBC 03/11/2023 8.4  4.0 - 10.5 K/uL Final   RBC 03/11/2023 4.52  3.87 - 5.11 MIL/uL Final   Hemoglobin 03/11/2023 9.7 (L)  12.0 - 15.0 g/dL Final   HCT 96/29/5284 31.6 (L)  36.0 - 46.0 % Final   MCV 03/11/2023 69.9 (L)  80.0 - 100.0 fL Final   MCH 03/11/2023 21.5  (L)  26.0 - 34.0 pg Final   MCHC 03/11/2023 30.7  30.0 - 36.0 g/dL Final   RDW 13/24/4010 19.4 (H)  11.5 - 15.5 % Final  Platelets 03/11/2023 PLATELET CLUMPS NOTED ON SMEAR, COUNT APPEARS ADEQUATE  150 - 400 K/uL Final   nRBC 03/11/2023 0.0  0.0 - 0.2 % Final   Neutrophils Relative % 03/11/2023 65  % Final   Neutro Abs 03/11/2023 5.5  1.7 - 7.7 K/uL Final   Lymphocytes Relative 03/11/2023 24  % Final   Lymphs Abs 03/11/2023 2.0  0.7 - 4.0 K/uL Final   Monocytes Relative 03/11/2023 9  % Final   Monocytes Absolute 03/11/2023 0.7  0.1 - 1.0 K/uL Final   Eosinophils Relative 03/11/2023 1  % Final   Eosinophils Absolute 03/11/2023 0.1  0.0 - 0.5 K/uL Final   Basophils Relative 03/11/2023 1  % Final   Basophils Absolute 03/11/2023 0.1  0.0 - 0.1 K/uL Final   WBC Morphology 03/11/2023 MORPHOLOGY UNREMARKABLE   Final   RBC Morphology 03/11/2023 MORPHOLOGY UNREMARKABLE   Final   Immature Granulocytes 03/11/2023 0  % Final   Abs Immature Granulocytes 03/11/2023 0.02  0.00 - 0.07 K/uL Final   Performed at [facility] Lab, [address], [place], [place] [address]   Sodium 03/11/2023 137  135 - 145 mmol/L Final   Potassium 03/11/2023 3.9  3.5 - 5.1 mmol/L Final   Chloride 03/11/2023 107  98 - 111 mmol/L Final   CO2 03/11/2023 20 (L)  22 - 32 mmol/L Final   Glucose, Bld 03/11/2023 93  70 - 99 mg/dL Final   Glucose reference range applies only to samples taken after fasting for at least 8 hours.   BUN 03/11/2023 8  6 - 20 mg/dL Final   Creatinine, Ser 03/11/2023 0.74  0.44 - 1.00 mg/dL Final   Calcium 60/45/4098 9.0  8.9 - 10.3 mg/dL Final   Total Protein 11/91/4782 7.2  6.5 - 8.1 g/dL Final   Albumin 95/62/1308 3.5  3.5 - 5.0 g/dL Final   AST 65/78/4696 44 (H)  15 - 41 U/L Final   ALT 03/11/2023 36  0 - 44 U/L Final   Alkaline Phosphatase 03/11/2023 53  38 - 126 U/L Final   Total Bilirubin 03/11/2023 0.4  0.3 - 1.2 mg/dL Final   GFR, Estimated 03/11/2023 >60  >60 mL/min Final   Comment:  (NOTE) Calculated using the CKD-EPI Creatinine Equation (2021)    Anion gap 03/11/2023 10  5 - 15 Final   Performed at [facility] Lab, [address] [place], [place] [address]   Hgb A1c MFr Bld 03/11/2023 5.6  4.8 - 5.6 % Final   Comment: (NOTE)         Prediabetes: 5.7 - 6.4         Diabetes: >6.4         Glycemic control for adults with diabetes: <7.0    Mean Plasma Glucose 03/11/2023 114  mg/dL Final   Comment: (NOTE) Performed At: [facility] [address] [place], [place] [id number] Jolene Schimke MD UV:[id number]    Magnesium 03/11/2023 1.9  1.7 - 2.4 mg/dL Final   Performed at [facility] Lab, [address] [place], [place] [address]   POC Amphetamine UR 03/11/2023 None Detected  NONE DETECTED (Cut Off Level 1000 ng/mL) Final   POC Secobarbital (BAR) 03/11/2023 None Detected  NONE DETECTED (Cut Off Level 300 ng/mL) Final   POC Buprenorphine (BUP) 03/11/2023 None Detected  NONE DETECTED (Cut Off Level 10 ng/mL) Final   POC Oxazepam (BZO) 03/11/2023 None Detected  NONE DETECTED (Cut Off Level 300 ng/mL) Final   POC  Cocaine UR 03/11/2023 Positive (A)  NONE DETECTED (Cut Off Level 300 ng/mL) Final   POC Methamphetamine UR 03/11/2023 None Detected  NONE DETECTED (Cut Off Level 1000 ng/mL) Final   POC Morphine 03/11/2023 None Detected  NONE DETECTED (Cut Off Level 300 ng/mL) Final   POC Methadone UR 03/11/2023 None Detected  NONE DETECTED (Cut Off Level 300 ng/mL) Final   POC Oxycodone UR 03/11/2023 None Detected  NONE DETECTED (Cut Off Level 100 ng/mL) Final   POC Marijuana UR 03/11/2023 Positive (A)  NONE DETECTED (Cut Off Level 50 ng/mL) Final   Color, Urine 03/11/2023 YELLOW  YELLOW Final   APPearance 03/11/2023 CLOUDY (A)  CLEAR Final   Specific Gravity, Urine 03/11/2023 1.025  1.005 - 1.030 Final   pH 03/11/2023 6.0  5.0 - 8.0 Final   Glucose, UA 03/11/2023 NEGATIVE  NEGATIVE mg/dL Final   Hgb urine dipstick 03/11/2023 NEGATIVE  NEGATIVE Final    Bilirubin Urine 03/11/2023 NEGATIVE  NEGATIVE Final   Ketones, ur 03/11/2023 NEGATIVE  NEGATIVE mg/dL Final   Protein, ur 82/95/6213 NEGATIVE  NEGATIVE mg/dL Final   Nitrite 08/65/7846 NEGATIVE  NEGATIVE Final   Leukocytes,Ua 03/11/2023 NEGATIVE  NEGATIVE Final   RBC / HPF 03/11/2023 0-5  0 - 5 RBC/hpf Final   WBC, UA 03/11/2023 0-5  0 - 5 WBC/hpf Final   Bacteria, UA 03/11/2023 RARE (A)  NONE SEEN Final   Squamous Epithelial / HPF 03/11/2023 21-50  0 - 5 /HPF Final   Mucus 03/11/2023 PRESENT   Final   Performed at [facility] Lab, [address], [place], [place] [address]   SARSCOV2ONAVIRUS 2 AG 03/11/2023 NEGATIVE  NEGATIVE Final   Comment: (NOTE) SARS-CoV-2 antigen NOT DETECTED.   Negative results are presumptive.  Negative results do not preclude SARS-CoV-2 infection and should not be used as the sole basis for treatment or other patient management decisions, including infection  control decisions, particularly in the presence of clinical signs and  symptoms consistent with COVID-19, or in those who have been in contact with the virus.  Negative results must be combined with clinical observations, patient history, and epidemiological information. The expected result is Negative.  Fact Sheet for Patients: https://www.jennings-kim.com/  Fact Sheet for Healthcare Providers: https://alexander-rogers.biz/  This test is not yet approved or cleared by the [place] FDA and  has been authorized for detection and/or diagnosis of SARS-CoV-2 by FDA under an Emergency Use Authorization (EUA).  This EUA will remain in effect (meaning this test can be used) for the duration of  the COV                          ID-19 declaration under Section 564(b)(1) of the Act, 21 U.S.C. section 360bbb-3(b)(1), unless the authorization is terminated or revoked sooner.     Preg Test, Ur 03/11/2023 NEGATIVE  NEGATIVE Final   Comment:        THE SENSITIVITY OF  THIS METHODOLOGY IS >24 mIU/mL    HIV Screen 4th Generation wRfx 03/13/2023 Non Reactive  Non Reactive Final   Performed at [facility] Lab, [address], [place], [place] [address]   RPR Ser Ql 03/13/2023 Reactive (A)  NON REACTIVE Final   SENT FOR CONFIRMATION   RPR Titer 03/13/2023 1:2   Final   Performed at [facility], [address], [place], [place] [address]   T Pallidum Abs 03/13/2023 Reactive (A)  Non Reactive Final  Comment: (NOTE) Performed At: [facility] [address] [place], [place] [id number] Jolene Schimke MD WG:[id number]     Allergies: Penicillins and Risperidone and related  Medications:  Facility Ordered Medications  Medication   acetaminophen (TYLENOL) tablet 650 mg   alum & mag hydroxide-simeth (MAALOX/MYLANTA) 200-200-20 MG/5ML suspension 30 mL   magnesium hydroxide (MILK OF MAGNESIA) suspension 30 mL   traZODone (DESYREL) tablet 50 mg   hydrOXYzine (ATARAX) tablet 25 mg   QUEtiapine (SEROQUEL) tablet 50 mg   PTA Medications  Medication Sig   QUEtiapine (SEROQUEL) 50 MG tablet Take 1 tablet (50 mg total) by mouth at bedtime.      Medical Decision Making  Patient is endorsing passive suicidal ideation and says she is unable to contract for safety. Patient will be admitted to [facility] [place] [facility] for continuous assessment with follow-up by psychaitry tomorrow.   Restart home medication  -Seroquel 50mg /day     Recommendations  Based on my evaluation the patient does not appear to have an emergency medical condition.  Maricela Bo, NP 09/03/23  3:19 PM

## 2023-09-03 NOTE — Progress Notes (Signed)
   09/03/23 1121  [facility] Triage Screening (Walk-ins at [facility] only)  How Did You Hear About [place]? Self  What Is the Reason for Your Visit/Call Today? Pt presents to [facility] voluntarily due to increased depression symptoms. Pt is crying hysterically. Pt reports she has been having issues with her boyfriend and it is causing her stress. Pt reports she is depressed and doesn't want to live anymore. Pt denies any plans to hurt herself at this time but cannot verbally contract for safety. Pt reports daily marijuana use and occasional alcohol use, last use of marijuana was today. Pt states she is established with outpatient psychiatry at [facility] and is prescribed Seroquel. She is not established with therapy.Pt denies HI and AVH.  How Long Has This Been Causing You Problems? <Week  Have You Recently Had Any Thoughts About Hurting Yourself? Yes  How long ago did you have thoughts about hurting yourself? today  Are You Planning to Commit Suicide/Harm Yourself At This time? No  Have you Recently Had Thoughts About Hurting Someone Karolee Ohs? No  Are You Planning To Harm Someone At This Time? No  Are you currently experiencing any auditory, visual or other hallucinations? No  Have You Used Any Alcohol or Drugs in the Past 24 Hours? Yes  How long ago did you use Drugs or Alcohol? today  What Did You Use and How Much? marijuana, unknown amount  Do you have any current medical co-morbidities that require immediate attention? No  Clinician description of patient physical appearance/behavior: tearful, guarded  What Do You Feel Would Help You the Most Today? Treatment for Depression or other mood problem  If access to [facility] was not available, would you have sought care in the Emergency Department? No  Determination of Need Urgent (48 hours)  Options For Referral [facility];Medication Management
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
## 2023-09-03 NOTE — ED Notes (Signed)
Pt admitted to obs Denies  SI/HI/AVH. Calm, cooperative throughout interview process. Skin assessment completed. Oriented to unit. Meal and drink offered. At currrent, pt continue to denies SI/HI/AVH. Pt verbally contract for safety. Will monitor for safety.
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
## 2023-09-04 NOTE — Discharge Instructions (Signed)

## 2023-09-04 NOTE — ED Notes (Signed)
Patient is sleeping. Respirations equal and unlabored, skin warm and dry. No change in assessment or acuity. Routine safety checks conducted according to facility protocol. Will continue to monitor for safety.   

## 2023-09-04 NOTE — ED Notes (Signed)
Pt resting quietly, breathing is even and unlabored.  Pt denies SI, HI, pain and AVH.  Will continue to monitor for safety.  

## 2023-09-04 NOTE — ED Provider Notes (Signed)
FBC/OBS ASAP Discharge Summary  Date and Time: 09/04/2023 11:47 AM  Name: Donna Zamora  MRN:  [id number]   Discharge Diagnoses:  Final diagnoses:  Substance induced mood disorder (HCC)  Polysubstance abuse (HCC)    Subjective: Donna Zamora is a 37 y/o female presenting with a history of depression and crack cocaine abuse. Patient presented voluntarily with worsening depression symptoms and passive suicidal ideations.   Stay Summary: On evaluation patient is alert oriented x4, calm and cooperative, mood is euthymic with congruent affect, speech is clear and coherent, thought process is logical and goal directed. Patient slept well during the night with no complaints. Patient denies any SI/HI or AVH at this time.  Patient does not appear to be responding to any internal or external stimuli at this time. Patient will be discharged to follow up with outpatient mental health resources at [facility] for ongoing medication management. Patient does not have any additional concerns. Patient is in agreement to follow up with outpatient resources.   Total Time spent with patient: 15 minutes  Past Psychiatric History: substance abuse, schizophrenia, personality disorder, PTSD, multiple inpatient treatment Past Medical History:  Past Medical History:  Diagnosis Date   Ectopic pregnancy     Family History: No family history on file.  Family Psychiatric History:unknown  Social History:  Social History   Socioeconomic History   Marital status: Single    Spouse name: Not on file   Number of children: Not on file   Years of education: Not on file   Highest education level: Not on file  Occupational History   Not on file  Tobacco Use   Smoking status: Every Day    Current packs/day: 0.50    Types: Cigarettes   Smokeless tobacco: Never  Substance and Sexual Activity   Alcohol use: No   Drug use: No   Sexual activity: Yes    Birth control/protection: None  Other Topics Concern   Not on file   Social History Narrative   Not on file   Social Determinants of Health   Financial Resource Strain: Not on file  Food Insecurity: Food Insecurity Present (09/03/2023)   Hunger Vital Sign    Worried About Running Out of Food in the Last Year: Often true    Ran Out of Food in the Last Year: Sometimes true  Transportation Needs: Unmet Transportation Needs (09/03/2023)   PRAPARE - Administrator, Civil Service (Medical): Yes    Lack of Transportation (Non-Medical): Yes  Physical Activity: Not on file  Stress: Not on file  Social Connections: Not on file  Intimate Partner Violence: Not At Risk (09/03/2023)   Humiliation, Afraid, Rape, and Kick questionnaire    Fear of Current or Ex-Partner: No    Emotionally Abused: No    Physically Abused: No    Sexually Abused: No    Tobacco Cessation:  A prescription for an FDA-approved tobacco cessation medication was offered at discharge and the patient refused  Current Medications:  Current Facility-Administered Medications  Medication Dose Route Frequency Provider Last Rate Last Admin   acetaminophen (TYLENOL) tablet 650 mg  650 mg Oral Q6H PRN Ajibola, Ene A, NP       alum & mag hydroxide-simeth (MAALOX/MYLANTA) 200-200-20 MG/5ML suspension 30 mL  30 mL Oral Q4H PRN Ajibola, Ene A, NP       hydrOXYzine (ATARAX) tablet 25 mg  25 mg Oral TID PRN Ajibola, Ene A, NP       magnesium hydroxide (MILK  OF MAGNESIA) suspension 30 mL  30 mL Oral Daily PRN Ajibola, Ene A, NP       QUEtiapine (SEROQUEL) tablet 50 mg  50 mg Oral QHS Ajibola, Ene A, NP   50 mg at 09/03/23 2204   traZODone (DESYREL) tablet 50 mg  50 mg Oral QHS PRN Ajibola, Ene A, NP   50 mg at 09/03/23 2205   Current Outpatient Medications  Medication Sig Dispense Refill   QUEtiapine (SEROQUEL) 50 MG tablet Take 1 tablet (50 mg total) by mouth at bedtime. 30 tablet 0    PTA Medications:  Facility Ordered Medications  Medication   acetaminophen (TYLENOL) tablet 650 mg   alum  & mag hydroxide-simeth (MAALOX/MYLANTA) 200-200-20 MG/5ML suspension 30 mL   magnesium hydroxide (MILK OF MAGNESIA) suspension 30 mL   traZODone (DESYREL) tablet 50 mg   hydrOXYzine (ATARAX) tablet 25 mg   QUEtiapine (SEROQUEL) tablet 50 mg   PTA Medications  Medication Sig   QUEtiapine (SEROQUEL) 50 MG tablet Take 1 tablet (50 mg total) by mouth at bedtime.       03/16/2023    1:43 PM 03/15/2023    1:47 PM 03/11/2023    5:54 PM  Depression screen PHQ 2/9  Decreased Interest 3 1 3   Down, Depressed, Hopeless 3 0 3  PHQ - 2 Score 6 1 6   Altered sleeping 2  3  Tired, decreased energy 3  3  Change in appetite 2  2  Feeling bad or failure about yourself  2  3  Trouble concentrating 2  1  Moving slowly or fidgety/restless 1  1  Suicidal thoughts 1  1  PHQ-9 Score 19  20  Difficult doing work/chores Extremely dIfficult  Very difficult    Flowsheet Row ED from 09/03/2023 in [facility] ED from 03/17/2023 in [facility] ED from 03/11/2023 in [facility]  C-SSRS RISK CATEGORY Low Risk No Risk No Risk       Musculoskeletal  Strength & Muscle Tone: within normal limits Gait & Station: normal Patient leans: N/A  Psychiatric Specialty Exam  Presentation  General Appearance:  Casual  Eye Contact: Fleeting  Speech: Clear and Coherent  Speech Volume: Normal  Handedness: Right   Mood and Affect  Mood: Euthymic  Affect: Appropriate   Thought Process  Thought Processes: Coherent  Descriptions of Associations:Intact  Orientation:Full (Time, Place and Person)  Thought Content:WDL  Diagnosis of Schizophrenia or Schizoaffective disorder in past: No    Hallucinations:Hallucinations: None  Ideas of Reference:None  Suicidal Thoughts:Suicidal Thoughts: No SI Passive Intent and/or Plan: Without Intent; Without Plan  Homicidal Thoughts:Homicidal Thoughts: No   Sensorium   Memory: Immediate Good; Recent Good; Remote Good  Judgment: Fair  Insight: Fair   Art therapist  Concentration: Good  Attention Span: Good  Recall: Good  Fund of Knowledge: Good  Language: Good   Psychomotor Activity  Psychomotor Activity: Psychomotor Activity: Normal   Assets  Assets: Communication Skills; Desire for Improvement; Housing; Physical Health   Sleep  Sleep: Sleep: Good Number of Hours of Sleep: 6   Nutritional Assessment (For OBS and FBC admissions only) Has the patient had a weight loss or gain of 10 pounds or more in the last 3 months?: No Has the patient had a decrease in food intake/or appetite?: No Does the patient have dental problems?: No Does the patient have eating habits or behaviors that may be indicators of an eating disorder including  binging or inducing vomiting?: No Has the patient recently lost weight without trying?: 0 Has the patient been eating poorly because of a decreased appetite?: 0 Malnutrition Screening Tool Score: 0    Physical Exam  Physical Exam Constitutional:      Appearance: Normal appearance.  HENT:     Head: Normocephalic and atraumatic.     Nose: Nose normal.  Eyes:     Pupils: Pupils are equal, round, and reactive to light.  Cardiovascular:     Rate and Rhythm: Normal rate.  Pulmonary:     Effort: Pulmonary effort is normal.  Abdominal:     General: Abdomen is flat.  Musculoskeletal:        General: Normal range of motion.     Cervical back: Normal range of motion.  Skin:    General: Skin is warm.  Neurological:     Mental Status: She is alert and oriented to person, place, and time.  Psychiatric:        Attention and Perception: Attention normal.        Mood and Affect: Mood normal.        Speech: Speech normal.        Behavior: Behavior normal.        Thought Content: Thought content normal.        Cognition and Memory: Cognition normal.        Judgment: Judgment is impulsive.     Review of Systems  Constitutional: Negative.   HENT: Negative.    Eyes: Negative.   Respiratory: Negative.    Cardiovascular: Negative.   Gastrointestinal: Negative.   Genitourinary: Negative.   Musculoskeletal: Negative.   Skin: Negative.   Neurological: Negative.   Endo/Heme/Allergies: Negative.   Psychiatric/Behavioral:  Positive for depression.    Blood pressure 106/70, pulse 72, temperature 98.5 F (36.9 C), resp. rate 18, SpO2 100%, unknown if currently breastfeeding. There is no height or weight on file to calculate BMI.  Demographic Factors:  Low socioeconomic status  Loss Factors: NA  Historical Factors: Impulsivity  Risk Reduction Factors:   Positive social support and Positive therapeutic relationship  Continued Clinical Symptoms:  Alcohol/Substance Abuse/Dependencies Schizophrenia:   Depressive state Personality Disorders:   Comorbid alcohol abuse/dependence Previous Psychiatric Diagnoses and Treatments  Cognitive Features That Contribute To Risk:  None    Suicide Risk:  Minimal: No identifiable suicidal ideation.  Patients presenting with no risk factors but with morbid ruminations; may be classified as minimal risk based on the severity of the depressive symptoms  Plan Of Care/Follow-up recommendations:  Activity as tolerated Diet-heart healthy  Disposition: Follow up with outpatient psychiatric provider at [facility], Take psychiatric psychiatric medications as prescribed. Recommend  abstinence from alcohol, tobacco and other illicit drug use at discharge.  If psychiatric symptoms recur, call your outpatient psychiatric provider, 911, [phone] or go to the nearest emergency department.  If suicidal thought recur, call your outpatient psychiatric provider, 911, [phone] or go to the nearest emergency department.    Jasper Riling, NP 09/04/2023, 11:47 AM

## 2023-09-04 NOTE — ED Notes (Signed)
Pt was provided lunch

## 2024-03-10 ENCOUNTER — Ambulatory Visit (HOSPITAL_COMMUNITY)
Admission: EM | Admit: 2024-03-10 | Discharge: 2024-03-11 | Disposition: A | Discharge status: Other Acute Inpatient Hospital | Attending: Psychiatry | Admitting: Psychiatry

## 2024-03-10 DIAGNOSIS — Z560 Unemployment, unspecified: Secondary | ICD-10-CM | POA: Insufficient documentation

## 2024-03-10 DIAGNOSIS — F319 Bipolar disorder, unspecified: Secondary | ICD-10-CM | POA: Insufficient documentation

## 2024-03-10 DIAGNOSIS — R45851 Suicidal ideations: Secondary | ICD-10-CM | POA: Insufficient documentation

## 2024-03-10 DIAGNOSIS — F411 Generalized anxiety disorder: Secondary | ICD-10-CM | POA: Insufficient documentation

## 2024-03-10 DIAGNOSIS — F339 Major depressive disorder, recurrent, unspecified: Secondary | ICD-10-CM

## 2024-03-10 DIAGNOSIS — F1410 Cocaine abuse, uncomplicated: Secondary | ICD-10-CM

## 2024-03-10 DIAGNOSIS — F1910 Other psychoactive substance abuse, uncomplicated: Secondary | ICD-10-CM

## 2024-03-10 DIAGNOSIS — Z5900 Homelessness unspecified: Secondary | ICD-10-CM

## 2024-03-10 DIAGNOSIS — I498 Other specified cardiac arrhythmias: Secondary | ICD-10-CM | POA: Insufficient documentation

## 2024-03-10 LAB — POCT URINE DRUG SCREEN - MANUAL ENTRY (I-SCREEN)
POC Amphetamine UR: NOT DETECTED
POC Buprenorphine (BUP): NOT DETECTED
POC Cocaine UR: POSITIVE — AB
POC Marijuana UR: POSITIVE — AB
POC Methadone UR: NOT DETECTED
POC Methamphetamine UR: POSITIVE — AB
POC Morphine: NOT DETECTED
POC Oxazepam (BZO): NOT DETECTED
POC Oxycodone UR: NOT DETECTED
POC Secobarbital (BAR): NOT DETECTED

## 2024-03-10 LAB — POC URINE PREG, ED: Preg Test, Ur: NEGATIVE

## 2024-03-10 MED ORDER — ACETAMINOPHEN 325 MG PO TABS: 650.0000 mg | ORAL_TABLET | Freq: Four times a day (QID) | ORAL | Status: DC | PRN

## 2024-03-10 MED ORDER — NAPROXEN 500 MG PO TABS: 500.0000 mg | ORAL_TABLET | Freq: Two times a day (BID) | ORAL | Status: DC | PRN

## 2024-03-10 MED ORDER — CLONIDINE HCL 0.1 MG PO TABS: 0.1000 mg | ORAL_TABLET | Freq: Every day | ORAL | Status: DC

## 2024-03-10 MED ORDER — METHOCARBAMOL 500 MG PO TABS: 500.0000 mg | ORAL_TABLET | Freq: Three times a day (TID) | ORAL | Status: DC | PRN

## 2024-03-10 MED ORDER — OLANZAPINE 10 MG IM SOLR: 10.0000 mg | Freq: Three times a day (TID) | INTRAMUSCULAR | Status: DC | PRN

## 2024-03-10 MED ORDER — DICYCLOMINE HCL 20 MG PO TABS: 20.0000 mg | ORAL_TABLET | Freq: Four times a day (QID) | ORAL | Status: DC | PRN

## 2024-03-10 MED ORDER — LOPERAMIDE HCL 2 MG PO CAPS: 2.0000 mg | ORAL_CAPSULE | ORAL | Status: DC | PRN

## 2024-03-10 MED ORDER — CLONIDINE HCL 0.1 MG PO TABS: 0.1000 mg | ORAL_TABLET | ORAL | Status: DC

## 2024-03-10 MED ORDER — CLONIDINE HCL 0.1 MG PO TABS
0.1000 mg | ORAL_TABLET | Freq: Four times a day (QID) | ORAL | Status: DC
  Administered 2024-03-10 – 2024-03-11 (×2): 0.1 mg via ORAL
  Filled 2024-03-10 (×3): qty 1

## 2024-03-10 MED ORDER — ALUM & MAG HYDROXIDE-SIMETH 200-200-20 MG/5ML PO SUSP: 30.0000 mL | ORAL | Status: DC | PRN

## 2024-03-10 MED ORDER — MAGNESIUM HYDROXIDE 400 MG/5ML PO SUSP: 30.0000 mL | Freq: Every day | ORAL | Status: DC | PRN

## 2024-03-10 MED ORDER — OLANZAPINE 5 MG PO TBDP
5.0000 mg | ORAL_TABLET | Freq: Three times a day (TID) | ORAL | Status: DC | PRN
  Administered 2024-03-10: 5 mg via ORAL
  Filled 2024-03-10: qty 1

## 2024-03-10 MED ORDER — HYDROXYZINE HCL 25 MG PO TABS
25.0000 mg | ORAL_TABLET | Freq: Four times a day (QID) | ORAL | Status: DC | PRN
  Administered 2024-03-10: 25 mg via ORAL
  Filled 2024-03-10: qty 1

## 2024-03-10 MED ORDER — OLANZAPINE 10 MG IM SOLR: 5.0000 mg | Freq: Three times a day (TID) | INTRAMUSCULAR | Status: DC | PRN

## 2024-03-10 MED ORDER — ONDANSETRON 4 MG PO TBDP: 4.0000 mg | ORAL_TABLET | Freq: Four times a day (QID) | ORAL | Status: DC | PRN

## 2024-03-10 NOTE — ED Provider Notes (Signed)
 [facility] Continuous Assessment Admission H&P  Date: 03/10/24 Patient Name: Donna Zamora MRN: [id number] Chief Complaint: what help with cocaine use  Diagnoses:  Final diagnoses:  Cocaine abuse (HCC)  Recurrent major depressive disorder, remission status unspecified (HCC)  Homelessness  Polysubstance abuse (HCC)    HPI: Correna Meacham, 38 y/o female with a bipolar disorder, MDD, polysubstance abuse, cocaine abuse, presented to [facility] voluntarily.  Per the patient she has tried to get some help with cocaine abuse.  According to her she last used cocaine a few minutes before coming in.  Patient does appear to be under the influence of drugs.  According to patient she is in the [facility] area currently staying with friends.  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  Patient does have a history of prior hospitalization for substance abuse.  According to patient she is tired and wants to to get help now.  Copied from triage notes: Pt presents to [facility] as a voluntary walk-in, unaccompanied with complaint of passive SI and requesting substance abuse treatment. Pt reports using crack daily for the past five years. Pt last used about an hour ago (unknown amount). Pt is very tearful during triage process. Pt states " I just need to get help, I'm tired of living like this". Pt has history of substance induced mood disorder, SI and depression. Pt reports being prescribed Seroquel in the past, but has not had medication since she was last admitted to the hospital. Pt denies suicide plan/intent, however she does not care if she lives or dies at this point. Pt currently denies HI,AVH and alcohol use   Face-to-face observation of patient, patient is alert and oriented x 4, speech is clear sometimes muffled.  Patient mood is fidgety unable to sit still picking at herself.  Patient stated that she just did crack cocaine before coming here so that does explain her fidgety mood.  Does  appear to be high on drugs.  Patient denies SI, HI, AVH or paranoia denied wanting to hurt herself or others.  According to patient she used crack every day for the last 5 years.  Patient denies alcohol use.  Per the patient she was staying with some friends in [place].  But it does appear patient is currently homeless.  Patient denies taking any current prescribed medications.  Given patient current presentation.  Discussed with patient the need for observation.  And further evaluation.   Recommend observation unit  Total Time spent with patient: 30 minutes  Musculoskeletal  Strength & Muscle Tone: within normal limits Gait & Station: normal Patient leans: N/A  Psychiatric Specialty Exam  Presentation General Appearance:  Disheveled  Eye Contact: Fleeting  Speech: Garbled  Speech Volume: Normal  Handedness: Right   Mood and Affect  Mood: Euthymic  Affect: Blunt; Inappropriate   Thought Process  Thought Processes: Coherent  Descriptions of Associations:Intact  Orientation:Full (Time, Place and Person)  Thought Content:Rumination; Scattered  Diagnosis of Schizophrenia or Schizoaffective disorder in past: No   Hallucinations:Hallucinations: None  Ideas of Reference:None  Suicidal Thoughts:Suicidal Thoughts: No  Homicidal Thoughts:Homicidal Thoughts: No   Sensorium  Memory: Immediate Fair  Judgment: Impaired  Insight: Lacking   Executive Functions  Concentration: Fair  Attention Span: Fair  Recall: Fair  Fund of Knowledge: Fair  Language: Fair   Psychomotor Activity  Psychomotor Activity: Psychomotor Activity: Normal   Assets  Assets: Desire for Improvement; Housing; Resilience; Vocational/Educational   Sleep  Sleep: Sleep: Fair  Number of Hours of Sleep: 6   Nutritional Assessment (For OBS and FBC admissions only) Has the patient had a weight loss or gain of 10 pounds or more in the last 3 months?: No Has the  patient had a decrease in food intake/or appetite?: No Does the patient have dental problems?: No Does the patient have eating habits or behaviors that may be indicators of an eating disorder including binging or inducing vomiting?: No Has the patient recently lost weight without trying?: 0 Has the patient been eating poorly because of a decreased appetite?: 0 Malnutrition Screening Tool Score: 0    Physical Exam HENT:     Head: Normocephalic.     Nose: Nose normal.  Eyes:     Pupils: Pupils are equal, round, and reactive to light.  Cardiovascular:     Rate and Rhythm: Normal rate.  Pulmonary:     Effort: Pulmonary effort is normal.  Musculoskeletal:        General: Normal range of motion.     Cervical back: Normal range of motion.  Neurological:     General: No focal deficit present.     Mental Status: She is alert.  Psychiatric:        Mood and Affect: Mood normal.        Behavior: Behavior normal.        Thought Content: Thought content normal.        Judgment: Judgment normal.    Review of Systems  Constitutional: Negative.   HENT: Negative.    Eyes: Negative.   Respiratory: Negative.    Cardiovascular: Negative.   Gastrointestinal: Negative.   Genitourinary: Negative.   Musculoskeletal: Negative.   Skin: Negative.   Neurological: Negative.   Psychiatric/Behavioral:  Positive for depression and substance abuse. The patient is nervous/anxious.     Blood pressure (!) 148/96, pulse 92, temperature 98.4 F (36.9 C), temperature source Oral, resp. rate 20, SpO2 96%, unknown if currently breastfeeding. There is no height or weight on file to calculate BMI.  Past Psychiatric History: Bipolar disorder, substance-induced mood disorder, suicidal ideation, polysubstance abuse.  Is the patient at risk to self? No  Has the patient been a risk to self in the past 6 months? No .    Has the patient been a risk to self within the distant past? No   Is the patient a risk to  others? No   Has the patient been a risk to others in the past 6 months? No   Has the patient been a risk to others within the distant past? No   Past Medical History: See chart  Family History: Unknown  Social History: Cocaine abuse  Last Labs:  No visits with results within 6 Month(s) from this visit.  Latest known visit with results is:  Admission on 09/03/2023, Discharged on 09/04/2023  Component Date Value Ref Range Status   WBC 09/03/2023 9.8  4.0 - 10.5 K/uL Final   RBC 09/03/2023 4.61  3.87 - 5.11 MIL/uL Final   Hemoglobin 09/03/2023 10.6 (L)  12.0 - 15.0 g/dL Final   HCT 16/09/9603 33.9 (L)  36.0 - 46.0 % Final   MCV 09/03/2023 73.5 (L)  80.0 - 100.0 fL Final   MCH 09/03/2023 23.0 (L)  26.0 - 34.0 pg Final   MCHC 09/03/2023 31.3  30.0 - 36.0 g/dL Final   RDW 54/08/8118 17.6 (H)  11.5 - 15.5 % Final   Platelets 09/03/2023 613 (H)  150 - 400 K/uL Final  nRBC 09/03/2023 0.0  0.0 - 0.2 % Final   Neutrophils Relative % 09/03/2023 66  % Final   Neutro Abs 09/03/2023 6.5  1.7 - 7.7 K/uL Final   Lymphocytes Relative 09/03/2023 25  % Final   Lymphs Abs 09/03/2023 2.4  0.7 - 4.0 K/uL Final   Monocytes Relative 09/03/2023 6  % Final   Monocytes Absolute 09/03/2023 0.6  0.1 - 1.0 K/uL Final   Eosinophils Relative 09/03/2023 2  % Final   Eosinophils Absolute 09/03/2023 0.2  0.0 - 0.5 K/uL Final   Basophils Relative 09/03/2023 1  % Final   Basophils Absolute 09/03/2023 0.1  0.0 - 0.1 K/uL Final   Immature Granulocytes 09/03/2023 0  % Final   Abs Immature Granulocytes 09/03/2023 0.04  0.00 - 0.07 K/uL Final   Performed at [facility], [address] [place], [place] [address]   Sodium 09/03/2023 139  135 - 145 mmol/L Final   Potassium 09/03/2023 3.4 (L)  3.5 - 5.1 mmol/L Final   Chloride 09/03/2023 101  98 - 111 mmol/L Final   CO2 09/03/2023 23  22 - 32 mmol/L Final   Glucose, Bld 09/03/2023 108 (H)  70 - 99 mg/dL Final   Glucose reference range applies only to samples  taken after fasting for at least 8 hours.   BUN 09/03/2023 13  6 - 20 mg/dL Final   Creatinine, Ser 09/03/2023 0.69  0.44 - 1.00 mg/dL Final   Calcium 60/45/4098 9.9  8.9 - 10.3 mg/dL Final   Total Protein 11/91/4782 7.2  6.5 - 8.1 g/dL Final   Albumin 95/62/1308 3.7  3.5 - 5.0 g/dL Final   AST 65/78/4696 49 (H)  15 - 41 U/L Final   ALT 09/03/2023 46 (H)  0 - 44 U/L Final   Alkaline Phosphatase 09/03/2023 63  38 - 126 U/L Final   Total Bilirubin 09/03/2023 0.5  0.3 - 1.2 mg/dL Final   GFR, Estimated 09/03/2023 >60  >60 mL/min Final   Comment: (NOTE) Calculated using the CKD-EPI Creatinine Equation (2021)    Anion gap 09/03/2023 15  5 - 15 Final   Performed at [facility], [address] [place], [place] [address]   Alcohol, Ethyl (B) 09/03/2023 <10  <10 mg/dL Final   Comment: (NOTE) Lowest detectable limit for serum alcohol is 10 mg/dL.  For medical purposes only. Performed at [facility], [address], [place], [place] [address]    TSH 09/03/2023 0.718  0.350 - 4.500 uIU/mL Final   Comment: Performed by a 3rd Generation assay with a functional sensitivity of <=0.01 uIU/mL. Performed at [facility], [address], [place], [place] [address]    Preg Test, Ur 09/03/2023 Negative  Negative Final   POC Amphetamine UR 09/03/2023 None Detected  NONE DETECTED (Cut Off Level 1000 ng/mL) Final   POC Secobarbital (BAR) 09/03/2023 None Detected  NONE DETECTED (Cut Off Level 300 ng/mL) Final   POC Buprenorphine (BUP) 09/03/2023 None Detected  NONE DETECTED (Cut Off Level 10 ng/mL) Final   POC Oxazepam (BZO) 09/03/2023 None Detected  NONE DETECTED (Cut Off Level 300 ng/mL) Final   POC Cocaine UR 09/03/2023 Positive (A)  NONE DETECTED (Cut Off Level 300 ng/mL) Final   POC Methamphetamine UR 09/03/2023 None Detected  NONE DETECTED (Cut Off Level 1000 ng/mL) Final   POC Morphine 09/03/2023 None Detected  NONE DETECTED (Cut Off Level 300 ng/mL) Final   POC Methadone UR  09/03/2023  None Detected  NONE DETECTED (Cut Off Level 300 ng/mL) Final   POC Oxycodone UR 09/03/2023 None Detected  NONE DETECTED (Cut Off Level 100 ng/mL) Final   POC Marijuana UR 09/03/2023 Positive (A)  NONE DETECTED (Cut Off Level 50 ng/mL) Final    Allergies: Penicillins and Risperidone and related  Medications:  PTA Medications  Medication Sig   QUEtiapine (SEROQUEL) 50 MG tablet Take 1 tablet (50 mg total) by mouth at bedtime.      Medical Decision Making  Observation unit    Recommendations  Based on my evaluation the patient does not appear to have an emergency medical condition.  Sindy Guadeloupe, NP 03/10/24  10:14 PM
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
## 2024-03-11 ENCOUNTER — Other Ambulatory Visit: Payer: Self-pay

## 2024-03-11 LAB — CBC WITH DIFFERENTIAL/PLATELET
Abs Immature Granulocytes: 0.03 10*3/uL (ref 0.00–0.07)
Basophils Absolute: 0.1 10*3/uL (ref 0.0–0.1)
Basophils Relative: 1 %
Eosinophils Absolute: 0.1 10*3/uL (ref 0.0–0.5)
Eosinophils Relative: 1 %
HCT: 30.8 % — ABNORMAL LOW (ref 36.0–46.0)
Hemoglobin: 9.9 g/dL — ABNORMAL LOW (ref 12.0–15.0)
Immature Granulocytes: 0 %
Lymphocytes Relative: 25 %
Lymphs Abs: 2.2 10*3/uL (ref 0.7–4.0)
MCH: 24.2 pg — ABNORMAL LOW (ref 26.0–34.0)
MCHC: 32.1 g/dL (ref 30.0–36.0)
MCV: 75.3 fL — ABNORMAL LOW (ref 80.0–100.0)
Monocytes Absolute: 0.6 10*3/uL (ref 0.1–1.0)
Monocytes Relative: 6 %
Neutro Abs: 5.9 10*3/uL (ref 1.7–7.7)
Neutrophils Relative %: 67 %
Platelets: 558 10*3/uL — ABNORMAL HIGH (ref 150–400)
RBC: 4.09 MIL/uL (ref 3.87–5.11)
RDW: 15.6 % — ABNORMAL HIGH (ref 11.5–15.5)
WBC: 8.7 10*3/uL (ref 4.0–10.5)
nRBC: 0 % (ref 0.0–0.2)

## 2024-03-11 LAB — COMPREHENSIVE METABOLIC PANEL
ALT: 47 U/L — ABNORMAL HIGH (ref 0–44)
AST: 52 U/L — ABNORMAL HIGH (ref 15–41)
Albumin: 3.9 g/dL (ref 3.5–5.0)
Alkaline Phosphatase: 51 U/L (ref 38–126)
Anion gap: 13 (ref 5–15)
BUN: 6 mg/dL (ref 6–20)
CO2: 19 mmol/L — ABNORMAL LOW (ref 22–32)
Calcium: 9.3 mg/dL (ref 8.9–10.3)
Chloride: 106 mmol/L (ref 98–111)
Creatinine, Ser: 0.82 mg/dL (ref 0.44–1.00)
GFR, Estimated: >60 mL/min (ref >60)
Glucose, Bld: 86 mg/dL (ref 70–99)
Potassium: 3.4 mmol/L — ABNORMAL LOW (ref 3.5–5.1)
Sodium: 138 mmol/L (ref 135–145)
Total Bilirubin: 0.7 mg/dL (ref 0.0–1.2)
Total Protein: 7.1 g/dL (ref 6.5–8.1)

## 2024-03-11 LAB — ETHANOL: Alcohol, Ethyl (B): <10 mg/dL (ref <10)

## 2024-03-11 LAB — TSH: TSH: 0.844 u[IU]/mL (ref 0.350–4.500)

## 2024-03-11 NOTE — ED Notes (Signed)
 Pt A&O x 4, anxious and restless, presents with SI, depression, no plan noted.  Pt admits to crack use for pasts 5 years.  Pt cooperative, comfort measures given. Denies HI or AVH.  Monitoring for safety.

## 2024-03-11 NOTE — Progress Notes (Signed)
 LCSW Progress Note  [id number]   Donna Zamora  03/11/2024  11:47 AM  Description:   Inpatient Psychiatric Referral  Patient was recommended inpatient per York Pellant NP). There are no available beds at [facility], per [facility] Victory Dakin RN ). Patient was referred to the following out of network facilities:   [facility] Provider Address Phone Fax  [facility] [address], [place] [address] [phone]  [facility] [address] [place] [address] [phone]  [facility] [address] [place], [place] [address] [phone]  [facility] [address] [place] [address] [phone]  [facility] [address] [place] [address] [phone]  [facility] [address] [place] [address] [phone]  [facility] [address], [place] [address] [phone]  [facility] EFAX [address] [place], [place] [phone]  [facility] [address] [place] [address] [phone]  [facility] [address] [place] [address] [phone]      Situation ongoing, CSW to continue following and update chart as more information becomes available.    [place] Aleasha Fregeau, MSW, LCSW  03/11/2024 11:47 AM

## 2024-03-11 NOTE — ED Notes (Signed)
 Patient currently asleep. Breathing even and unlabored with even rise and fall of chest. No s/s of current distress.

## 2024-03-11 NOTE — Discharge Instructions (Addendum)
[facility]

## 2024-03-11 NOTE — Progress Notes (Signed)
 Pt has been accepted to [facility] TODAY 03/11/2024 Bed assignment: Main campus  Pt meets inpatient criteria per: Sindy Guadeloupe NP   Attending Physician will be Loni Beckwith, MD  Report can be called to: [phone] (this is a pager, please leave call-back number when giving report)  Pt can arrive ASAP   Care Team Notified:  Joaquin Courts NP, Sharion Dove RN,   [place] Georgana Romain LCSW-A   03/11/2024 12:19 PM

## 2024-03-11 NOTE — ED Notes (Signed)
 Patient is transferring to [facility] at this time via safe transport. Transfer paperwork sent with patient as well as all valuables/belongings. Patient is A&OX4 and cooperative at time of transport. No s/s of current distress.

## 2024-03-11 NOTE — ED Notes (Signed)
 Patient appears tired

## 2024-03-11 NOTE — ED Notes (Signed)
 Patient observed/assessed in bed/chair resting quietly appearing in no distress and verbalizing no complaints at this time. Will continue to monitor.

## 2024-03-11 NOTE — ED Notes (Signed)
 Report called to [facility]. Safe transport contacted. Awaiting transportation at this time.

## 2024-03-12 NOTE — ED Provider Notes (Signed)
 [facility] ASAP Discharge Summary  Date and Time: 03/12/2024 1:11 AM  Name: Donna Zamora  MRN:  [id number]   Discharge Diagnoses:  Final diagnoses:  Cocaine abuse (HCC)  Recurrent major depressive disorder, remission status unspecified (HCC)  Homelessness  Polysubstance abuse (HCC)    Subjective: ***  Stay Summary: ***  Total Time spent with patient: 45 minutes  Past Psychiatric History: *** Past Medical History: *** Family History: *** Family Psychiatric History: *** Social History: *** Tobacco Cessation:  N/A, patient does not currently use tobacco products  Current Medications:  No current facility-administered medications for this encounter.   Current Outpatient Medications  Medication Sig Dispense Refill   QUEtiapine (SEROQUEL) 50 MG tablet Take 1 tablet (50 mg total) by mouth at bedtime. (Patient not taking: Reported on 03/11/2024) 30 tablet 0    PTA Medications:  PTA Medications  Medication Sig   QUEtiapine (SEROQUEL) 50 MG tablet Take 1 tablet (50 mg total) by mouth at bedtime. (Patient not taking: Reported on 03/11/2024)       03/16/2023    1:43 PM 03/15/2023    1:47 PM 03/11/2023    5:54 PM  Depression screen PHQ 2/9  Decreased Interest 3 1 3   Down, Depressed, Hopeless 3 0 3  PHQ - 2 Score 6 1 6   Altered sleeping 2  3  Tired, decreased energy 3  3  Change in appetite 2  2  Feeling bad or failure about yourself  2  3  Trouble concentrating 2  1  Moving slowly or fidgety/restless 1  1  Suicidal thoughts 1  1  PHQ-9 Score 19  20  Difficult doing work/chores Extremely dIfficult  Very difficult    Flowsheet Row ED from 03/10/2024 in [facility] ED from 09/03/2023 in [facility] from 03/17/2023 in [facility]  C-SSRS RISK CATEGORY Low Risk Low Risk No Risk       Musculoskeletal  Strength & Muscle Tone: within normal limits Gait & Station: normal Patient leans:  N/A  Psychiatric Specialty Exam  Presentation  General Appearance:  Disheveled  Eye Contact: Fleeting  Speech: Garbled  Speech Volume: Normal  Handedness: Right   Mood and Affect  Mood: Euthymic  Affect: Blunt; Inappropriate   Thought Process  Thought Processes: Coherent  Descriptions of Associations:Intact  Orientation:Full (Time, Place and Person)  Thought Content:Rumination; Scattered  Diagnosis of Schizophrenia or Schizoaffective disorder in past: No    Hallucinations:No data recorded Ideas of Reference:None  Suicidal Thoughts:No data recorded Homicidal Thoughts:No data recorded  Sensorium  Memory: Immediate Fair  Judgment: Impaired  Insight: Lacking   Executive Functions  Concentration: Fair  Attention Span: Fair  Recall: [employer] of Knowledge: Fair  Language: Fair   Psychomotor Activity  Psychomotor Activity:No data recorded  Assets  Assets: Desire for Improvement; Housing; Resilience; Vocational/Educational   Sleep  Sleep:No data recorded  No data recorded  Physical Exam  Physical Exam ROS Blood pressure 114/75, pulse 73, temperature 98.5 F (36.9 C), resp. rate 18, SpO2 98%, unknown if currently breastfeeding. There is no height or weight on file to calculate BMI.  Demographic Factors:  Low socioeconomic status  Loss Factors: Decrease in vocational status and Financial problems/change in socioeconomic status  Historical Factors: Impulsivity  Risk Reduction Factors:   Personal goal of cessation of drug use  Continued Clinical Symptoms:  Alcohol/Substance Abuse/Dependencies Previous Psychiatric Diagnoses and Treatments  Cognitive Features That Contribute To Risk:  Thought  constriction (tunnel vision)    Suicide Risk:  Mild:  Suicidal ideation of limited frequency, intensity, duration, and specificity.  There are no identifiable plans, no associated intent, mild dysphoria and related symptoms, good  self-control (both objective and subjective assessment), few other risk factors, and identifiable protective factors, including available and accessible social support.  Plan Of Care/Follow-up recommendations:  Other:  Patient accepted for inpatient psychiatric treatment at [facility], pending inpatient placement today.   Disposition: [facility]  Joaquin Courts, [place] 03/12/2024, 1:11 AM
